# Patient Record
Sex: Female | Born: 1950
Health system: Southern US, Community
[De-identification: ages and names within clinical notes are randomized; demographics above are authoritative.]

## PROBLEM LIST (undated history)

## (undated) DIAGNOSIS — I10 Essential (primary) hypertension: Secondary | ICD-10-CM

## (undated) DIAGNOSIS — M109 Gout, unspecified: Secondary | ICD-10-CM

## (undated) DIAGNOSIS — Z8719 Personal history of other diseases of the digestive system: Secondary | ICD-10-CM

## (undated) DIAGNOSIS — R112 Nausea with vomiting, unspecified: Secondary | ICD-10-CM

## (undated) DIAGNOSIS — K5792 Diverticulitis of intestine, part unspecified, without perforation or abscess without bleeding: Secondary | ICD-10-CM

## (undated) DIAGNOSIS — K219 Gastro-esophageal reflux disease without esophagitis: Secondary | ICD-10-CM

## (undated) HISTORY — PX: TOTAL HIP ARTHROPLASTY: SHX124

## (undated) HISTORY — PX: HERNIA REPAIR: SHX51

---

## 1988-11-05 DIAGNOSIS — R87619 Unspecified abnormal cytological findings in specimens from cervix uteri: Secondary | ICD-10-CM | POA: Insufficient documentation

## 1998-01-18 ENCOUNTER — Ambulatory Visit (HOSPITAL_COMMUNITY): Admission: RE | Admit: 1998-01-18 | Discharge: 1998-01-18 | Payer: Self-pay | Admitting: *Deleted

## 1999-02-08 ENCOUNTER — Other Ambulatory Visit: Admission: RE | Admit: 1999-02-08 | Discharge: 1999-02-08 | Payer: Self-pay | Admitting: *Deleted

## 1999-02-21 ENCOUNTER — Ambulatory Visit (HOSPITAL_COMMUNITY): Admission: RE | Admit: 1999-02-21 | Discharge: 1999-02-21 | Payer: Self-pay | Admitting: *Deleted

## 2000-02-20 ENCOUNTER — Other Ambulatory Visit: Admission: RE | Admit: 2000-02-20 | Discharge: 2000-02-20 | Payer: Self-pay | Admitting: *Deleted

## 2000-02-23 ENCOUNTER — Ambulatory Visit (HOSPITAL_COMMUNITY): Admission: RE | Admit: 2000-02-23 | Discharge: 2000-02-23 | Payer: Self-pay | Admitting: *Deleted

## 2000-09-02 ENCOUNTER — Encounter: Payer: Self-pay | Admitting: Emergency Medicine

## 2000-09-02 ENCOUNTER — Emergency Department (HOSPITAL_COMMUNITY): Admission: EM | Admit: 2000-09-02 | Discharge: 2000-09-02 | Payer: Self-pay | Admitting: Emergency Medicine

## 2000-09-17 ENCOUNTER — Encounter: Admission: RE | Admit: 2000-09-17 | Discharge: 2000-09-17 | Payer: Self-pay | Admitting: Sports Medicine

## 2000-10-16 ENCOUNTER — Encounter: Admission: RE | Admit: 2000-10-16 | Discharge: 2000-10-16 | Payer: Self-pay | Admitting: Family Medicine

## 2000-11-08 ENCOUNTER — Encounter: Admission: RE | Admit: 2000-11-08 | Discharge: 2000-11-08 | Payer: Self-pay | Admitting: Family Medicine

## 2000-11-12 ENCOUNTER — Encounter: Admission: RE | Admit: 2000-11-12 | Discharge: 2000-11-12 | Payer: Self-pay | Admitting: Family Medicine

## 2000-12-17 ENCOUNTER — Encounter: Admission: RE | Admit: 2000-12-17 | Discharge: 2000-12-17 | Payer: Self-pay | Admitting: Family Medicine

## 2001-01-08 ENCOUNTER — Encounter: Admission: RE | Admit: 2001-01-08 | Discharge: 2001-01-08 | Payer: Self-pay | Admitting: Family Medicine

## 2001-02-17 ENCOUNTER — Encounter: Admission: RE | Admit: 2001-02-17 | Discharge: 2001-02-17 | Payer: Self-pay | Admitting: Family Medicine

## 2001-02-24 ENCOUNTER — Encounter: Admission: RE | Admit: 2001-02-24 | Discharge: 2001-02-24 | Payer: Self-pay | Admitting: Family Medicine

## 2001-04-18 ENCOUNTER — Encounter: Admission: RE | Admit: 2001-04-18 | Discharge: 2001-04-18 | Payer: Self-pay | Admitting: Family Medicine

## 2001-08-07 ENCOUNTER — Ambulatory Visit (HOSPITAL_COMMUNITY): Admission: RE | Admit: 2001-08-07 | Discharge: 2001-08-07 | Payer: Self-pay | Admitting: *Deleted

## 2001-08-07 ENCOUNTER — Encounter: Payer: Self-pay | Admitting: *Deleted

## 2001-12-11 ENCOUNTER — Encounter: Payer: Self-pay | Admitting: Orthopedic Surgery

## 2001-12-16 ENCOUNTER — Encounter: Payer: Self-pay | Admitting: Orthopedic Surgery

## 2001-12-16 ENCOUNTER — Inpatient Hospital Stay (HOSPITAL_COMMUNITY): Admission: RE | Admit: 2001-12-16 | Discharge: 2001-12-19 | Payer: Self-pay | Admitting: Orthopedic Surgery

## 2003-10-16 ENCOUNTER — Emergency Department (HOSPITAL_COMMUNITY): Admission: EM | Admit: 2003-10-16 | Discharge: 2003-10-16 | Payer: Self-pay | Admitting: Emergency Medicine

## 2003-10-28 ENCOUNTER — Ambulatory Visit (HOSPITAL_COMMUNITY): Admission: RE | Admit: 2003-10-28 | Discharge: 2003-10-28 | Payer: Self-pay | Admitting: Family Medicine

## 2004-06-07 ENCOUNTER — Ambulatory Visit (HOSPITAL_COMMUNITY): Admission: RE | Admit: 2004-06-07 | Discharge: 2004-06-07 | Payer: Self-pay | Admitting: Family Medicine

## 2004-07-21 ENCOUNTER — Ambulatory Visit: Payer: Self-pay | Admitting: Internal Medicine

## 2004-08-23 ENCOUNTER — Ambulatory Visit: Payer: Self-pay | Admitting: *Deleted

## 2004-09-12 ENCOUNTER — Ambulatory Visit: Payer: Self-pay | Admitting: Internal Medicine

## 2004-09-19 ENCOUNTER — Ambulatory Visit (HOSPITAL_COMMUNITY): Admission: RE | Admit: 2004-09-19 | Discharge: 2004-09-19 | Payer: Self-pay | Admitting: Internal Medicine

## 2004-09-20 ENCOUNTER — Ambulatory Visit: Payer: Self-pay | Admitting: Internal Medicine

## 2004-09-24 ENCOUNTER — Emergency Department (HOSPITAL_COMMUNITY): Admission: EM | Admit: 2004-09-24 | Discharge: 2004-09-24 | Payer: Self-pay | Admitting: Family Medicine

## 2004-09-27 ENCOUNTER — Ambulatory Visit: Payer: Self-pay | Admitting: Internal Medicine

## 2004-11-20 ENCOUNTER — Ambulatory Visit: Payer: Self-pay | Admitting: Internal Medicine

## 2004-12-11 ENCOUNTER — Ambulatory Visit: Payer: Self-pay | Admitting: Internal Medicine

## 2004-12-13 ENCOUNTER — Ambulatory Visit (HOSPITAL_COMMUNITY): Admission: RE | Admit: 2004-12-13 | Discharge: 2004-12-13 | Payer: Self-pay | Admitting: Internal Medicine

## 2004-12-25 ENCOUNTER — Ambulatory Visit: Payer: Self-pay | Admitting: Internal Medicine

## 2005-01-31 ENCOUNTER — Ambulatory Visit: Payer: Self-pay | Admitting: Internal Medicine

## 2005-02-06 ENCOUNTER — Inpatient Hospital Stay (HOSPITAL_COMMUNITY): Admission: RE | Admit: 2005-02-06 | Discharge: 2005-02-10 | Payer: Self-pay | Admitting: Orthopedic Surgery

## 2005-03-23 ENCOUNTER — Ambulatory Visit: Payer: Self-pay | Admitting: Internal Medicine

## 2005-06-12 ENCOUNTER — Ambulatory Visit: Payer: Self-pay | Admitting: Internal Medicine

## 2005-06-18 ENCOUNTER — Ambulatory Visit: Payer: Self-pay | Admitting: Internal Medicine

## 2005-07-14 ENCOUNTER — Emergency Department (HOSPITAL_COMMUNITY): Admission: EM | Admit: 2005-07-14 | Discharge: 2005-07-14 | Payer: Self-pay | Admitting: Family Medicine

## 2005-07-16 ENCOUNTER — Ambulatory Visit: Payer: Self-pay | Admitting: Internal Medicine

## 2005-08-31 ENCOUNTER — Ambulatory Visit: Payer: Self-pay | Admitting: Internal Medicine

## 2005-10-22 ENCOUNTER — Ambulatory Visit: Payer: Self-pay | Admitting: Family Medicine

## 2005-11-09 ENCOUNTER — Ambulatory Visit: Payer: Self-pay | Admitting: Internal Medicine

## 2005-11-20 ENCOUNTER — Ambulatory Visit: Payer: Self-pay | Admitting: Internal Medicine

## 2005-12-05 ENCOUNTER — Ambulatory Visit (HOSPITAL_COMMUNITY): Admission: RE | Admit: 2005-12-05 | Discharge: 2005-12-05 | Payer: Self-pay | Admitting: Internal Medicine

## 2005-12-19 ENCOUNTER — Ambulatory Visit: Payer: Self-pay | Admitting: Internal Medicine

## 2005-12-19 ENCOUNTER — Encounter: Payer: Self-pay | Admitting: Internal Medicine

## 2005-12-26 ENCOUNTER — Ambulatory Visit (HOSPITAL_COMMUNITY): Admission: RE | Admit: 2005-12-26 | Discharge: 2005-12-26 | Payer: Self-pay | Admitting: Internal Medicine

## 2006-01-07 ENCOUNTER — Ambulatory Visit (HOSPITAL_COMMUNITY): Admission: RE | Admit: 2006-01-07 | Discharge: 2006-01-07 | Payer: Self-pay | Admitting: Surgery

## 2006-02-15 ENCOUNTER — Ambulatory Visit: Payer: Self-pay | Admitting: Internal Medicine

## 2006-07-09 ENCOUNTER — Ambulatory Visit: Payer: Self-pay | Admitting: Family Medicine

## 2006-10-08 ENCOUNTER — Ambulatory Visit: Payer: Self-pay | Admitting: Family Medicine

## 2006-12-09 ENCOUNTER — Ambulatory Visit (HOSPITAL_COMMUNITY): Admission: RE | Admit: 2006-12-09 | Discharge: 2006-12-09 | Payer: Self-pay | Admitting: Internal Medicine

## 2006-12-10 ENCOUNTER — Ambulatory Visit: Payer: Self-pay | Admitting: Internal Medicine

## 2006-12-31 ENCOUNTER — Ambulatory Visit: Payer: Self-pay | Admitting: Internal Medicine

## 2007-02-06 ENCOUNTER — Ambulatory Visit: Payer: Self-pay | Admitting: Internal Medicine

## 2007-02-06 LAB — CONVERTED CEMR LAB
Cholesterol: 315 mg/dL
LDL Cholesterol: 218 mg/dL
Triglycerides: 117 mg/dL

## 2007-02-19 ENCOUNTER — Ambulatory Visit: Payer: Self-pay | Admitting: Family Medicine

## 2007-03-18 ENCOUNTER — Ambulatory Visit: Payer: Self-pay | Admitting: Internal Medicine

## 2007-03-18 ENCOUNTER — Encounter (INDEPENDENT_AMBULATORY_CARE_PROVIDER_SITE_OTHER): Payer: Self-pay | Admitting: *Deleted

## 2007-03-18 LAB — CONVERTED CEMR LAB: Creatinine, Ser: 0.8 mg/dL

## 2007-03-25 ENCOUNTER — Ambulatory Visit (HOSPITAL_COMMUNITY): Admission: RE | Admit: 2007-03-25 | Discharge: 2007-03-25 | Payer: Self-pay | Admitting: Internal Medicine

## 2007-04-21 ENCOUNTER — Ambulatory Visit: Payer: Self-pay | Admitting: Internal Medicine

## 2007-05-17 ENCOUNTER — Encounter (INDEPENDENT_AMBULATORY_CARE_PROVIDER_SITE_OTHER): Payer: Self-pay | Admitting: Internal Medicine

## 2007-05-17 DIAGNOSIS — K449 Diaphragmatic hernia without obstruction or gangrene: Secondary | ICD-10-CM | POA: Insufficient documentation

## 2007-05-17 DIAGNOSIS — G5620 Lesion of ulnar nerve, unspecified upper limb: Secondary | ICD-10-CM | POA: Insufficient documentation

## 2007-05-17 DIAGNOSIS — E782 Mixed hyperlipidemia: Secondary | ICD-10-CM | POA: Insufficient documentation

## 2007-05-17 DIAGNOSIS — M720 Palmar fascial fibromatosis [Dupuytren]: Secondary | ICD-10-CM

## 2007-05-17 DIAGNOSIS — N3090 Cystitis, unspecified without hematuria: Secondary | ICD-10-CM | POA: Insufficient documentation

## 2007-05-17 DIAGNOSIS — E669 Obesity, unspecified: Secondary | ICD-10-CM | POA: Insufficient documentation

## 2007-05-17 DIAGNOSIS — M1610 Unilateral primary osteoarthritis, unspecified hip: Secondary | ICD-10-CM | POA: Insufficient documentation

## 2007-05-17 DIAGNOSIS — K219 Gastro-esophageal reflux disease without esophagitis: Secondary | ICD-10-CM | POA: Insufficient documentation

## 2007-05-17 DIAGNOSIS — M87059 Idiopathic aseptic necrosis of unspecified femur: Secondary | ICD-10-CM | POA: Insufficient documentation

## 2007-05-17 DIAGNOSIS — J309 Allergic rhinitis, unspecified: Secondary | ICD-10-CM | POA: Insufficient documentation

## 2007-05-17 DIAGNOSIS — M169 Osteoarthritis of hip, unspecified: Secondary | ICD-10-CM | POA: Insufficient documentation

## 2007-05-17 HISTORY — DX: Palmar fascial fibromatosis (dupuytren): M72.0

## 2007-05-17 HISTORY — DX: Idiopathic aseptic necrosis of unspecified femur: M87.059

## 2007-05-17 HISTORY — DX: Mixed hyperlipidemia: E78.2

## 2007-05-21 ENCOUNTER — Ambulatory Visit: Payer: Self-pay | Admitting: Internal Medicine

## 2007-05-21 LAB — CONVERTED CEMR LAB: Cholesterol: 306 mg/dL

## 2007-05-29 ENCOUNTER — Ambulatory Visit: Payer: Self-pay | Admitting: Internal Medicine

## 2007-07-01 ENCOUNTER — Ambulatory Visit: Payer: Self-pay | Admitting: Internal Medicine

## 2007-07-23 ENCOUNTER — Encounter (INDEPENDENT_AMBULATORY_CARE_PROVIDER_SITE_OTHER): Payer: Self-pay | Admitting: *Deleted

## 2007-08-25 ENCOUNTER — Encounter (INDEPENDENT_AMBULATORY_CARE_PROVIDER_SITE_OTHER): Payer: Self-pay | Admitting: *Deleted

## 2007-08-25 ENCOUNTER — Ambulatory Visit (HOSPITAL_COMMUNITY): Admission: RE | Admit: 2007-08-25 | Discharge: 2007-08-25 | Payer: Self-pay | Admitting: *Deleted

## 2007-10-11 ENCOUNTER — Emergency Department (HOSPITAL_COMMUNITY): Admission: EM | Admit: 2007-10-11 | Discharge: 2007-10-11 | Payer: Self-pay | Admitting: Emergency Medicine

## 2008-03-25 ENCOUNTER — Ambulatory Visit (HOSPITAL_COMMUNITY): Admission: RE | Admit: 2008-03-25 | Discharge: 2008-03-25 | Payer: Self-pay | Admitting: Internal Medicine

## 2008-09-14 ENCOUNTER — Other Ambulatory Visit: Admission: RE | Admit: 2008-09-14 | Discharge: 2008-09-14 | Payer: Self-pay | Admitting: Internal Medicine

## 2009-03-28 ENCOUNTER — Ambulatory Visit (HOSPITAL_COMMUNITY): Admission: RE | Admit: 2009-03-28 | Discharge: 2009-03-28 | Payer: Self-pay | Admitting: Internal Medicine

## 2009-05-08 ENCOUNTER — Emergency Department (HOSPITAL_COMMUNITY): Admission: EM | Admit: 2009-05-08 | Discharge: 2009-05-08 | Payer: Self-pay | Admitting: Family Medicine

## 2009-09-22 ENCOUNTER — Other Ambulatory Visit: Admission: RE | Admit: 2009-09-22 | Discharge: 2009-09-22 | Payer: Self-pay | Admitting: Internal Medicine

## 2010-05-05 ENCOUNTER — Ambulatory Visit (HOSPITAL_COMMUNITY): Admission: RE | Admit: 2010-05-05 | Discharge: 2010-05-05 | Payer: Self-pay | Admitting: Internal Medicine

## 2010-11-26 ENCOUNTER — Encounter: Payer: Self-pay | Admitting: Internal Medicine

## 2011-03-20 NOTE — Op Note (Signed)
[name], [name]:  [id number]   MEDICAL RECORD NO.:  [id number]          PATIENT TYPE:  AMB   LOCATION:  ENDO                         FACILITY:  [facility]   PHYSICIAN:  Georgiana Spinner, M.D.    DATE OF BIRTH:  [birth year]   DATE OF PROCEDURE:  08/25/2007  DATE OF DISCHARGE:                               OPERATIVE REPORT   PROCEDURE:  Upper endoscopy with biopsy.   INDICATIONS:  GERD.   ANESTHESIA:  Phenergan 12.5 mg, fentanyl 75 mcg, Versed 7 mg.   PROCEDURE:  With the patient mildly sedated in the left lateral  decubitus position, the Pentax videoscopic endoscope was inserted in the  mouth, passed under direct vision through the esophagus, which appeared  normal until we reached distal esophagus, and there was a question of an  area of Barrett's, photographed and biopsied.  We entered into the  stomach through a hiatal hernia.  Fundus, body, antrum, duodenal bulb,  second portion of the duodenum appeared normal.  From this point the  endoscope was slowly withdrawn taking circumferential views of the  duodenal mucosa until the endoscope had been pulled back in the stomach,  placed in retroflexion to view the stomach from below.  The endoscope  was straightened and withdrawn taking circumferential views of the  remaining gastric and esophageal mucosa.  The patient's vital signs and  pulse oximetry remained stable.  The patient tolerated the procedure  well and without apparent complications.   FINDINGS:  Question of Barrett's esophagus above a hiatal hernia.   Await biopsy report.  The patient will call me for results and follow up  with me as an outpatient.  Proceed to colonoscopy as planned.           ______________________________  Georgiana Spinner, M.D.     GMO/MEDQ  D:  08/25/2007  T:  08/26/2007  Job:  [id number]

## 2011-03-20 NOTE — Op Note (Signed)
Angela Greer, Angela Greer NO.:  [id number]   MEDICAL RECORD NO.:  [id number]          PATIENT TYPE:  AMB   LOCATION:  ENDO                         FACILITY:  [facility]   PHYSICIAN:  Georgiana Spinner, M.D.    DATE OF BIRTH:  1951/06/01   DATE OF PROCEDURE:  08/25/2007  DATE OF DISCHARGE:                               OPERATIVE REPORT   PROCEDURE:  Colonoscopy.   INDICATIONS:  Colon polyps.   ANESTHESIA:  1. Fentanyl 25 mcg.  2. Versed 3 mg.   PROCEDURE:  With the patient mildly sedated in the left lateral  decubitus position, the Pentax videoscopic colonoscope was inserted into  the rectum and passed under direct vision to the cecum, identified by  the ileocecal valve and appendiceal orifice, both of which were  photographed.  From this point, the colonoscope was slowly withdrawn,  taking circumferential views of the colonic mucosa, stopping in the  descending colon, were a polyp was seen, photographed, and removed.  Unfortunately, the nurse inadvertently sliced through the polyp without  cautery.  Bleeding ensued.  I elected used hot biopsy to touch the  bleeding base, but oozing continued, so I injected 1 mL of epinephrine  into the area, and bleeding seemed to stop at that point.  The polyp,  however, was lost despite trying to suction it through the endoscope  into a tissue trap, but it appeared benign on photograph.  From this  point, the colonoscope was slowly withdrawn, taking circumferential  views of the remaining colonic mucosa, stopping just above the rectum,  where a second polyp was seen.  It too was photographed, and it was  removed using hot biopsy forceps technique, with the setting of 20/150  blended current.  Tissue was retrieved for pathology.  The endoscope was  withdrawn to the rectum, which appeared normal on direct and showed  hemorrhoids on retroflexed view.  The endoscope was straightened and  withdrawn.  The patient's vital signs and pulse  oximeter remained  stable.  The patient tolerated the procedure well without apparent  complications.   FINDINGS:  Internal hemorrhoids.  Polyps in descending colon and  rectosigmoid area.  Await biopsy report.  The patient will call me for  results and follow up with me as an outpatient.           ______________________________  Georgiana Spinner, M.D.     GMO/MEDQ  D:  08/25/2007  T:  08/26/2007  Job:  [id number]

## 2011-03-23 NOTE — Op Note (Signed)
NAMEDAJANAY, NORTHRUP NO.:  [id number]   MEDICAL RECORD NO.:  [id number]          PATIENT TYPE:  INP   LOCATION:  2550                         FACILITY:  [facility]   PHYSICIAN:  Deidre Ala, M.D.    DATE OF BIRTH:  August 25, 1951   DATE OF PROCEDURE:  02/06/2005  DATE OF DISCHARGE:                                 OPERATIVE REPORT   PREOPERATIVE DIAGNOSIS:  Whole head involvement arteriovenous nicking right  hip with pain.   POSTOPERATIVE DIAGNOSIS:  Whole head involvement arteriovenous nicking right  hip with pain.   PROCEDURE:  Right total hip arthroplasty using uncemented DePuy components,  pinnacle cup and SUMMIT tapered stem with HA coating.   SURGEON:  1.  Charlesetta Shanks, M.D.   ASSISTANT:  Clarene Reamer, P.A.-C.   ANESTHESIA:  General endotracheal anesthesia.   CULTURES:  None.   DRAINS:  Two medium Hemovacs to Autovac.   ESTIMATED BLOOD LOSS:  300 mL.   REPLACEMENT:  Without.   PATHOLOGIC FINDINGS AND HISTORY:  Shakila underwent successful left total hip  arthroplasty in 2003 with good result with a ceramic head and polyethylene  liner, Osteonics type.  She began to have right hip pain, saw [facility], saw Dineen Kid. Reche Dixon, M.D., was sponsored under  vocational rehab and came in with her MRI scan.  She was having  Trendelenburg gait with marked pain and limited internal and external  rotation arch.  X-rays did not reveal overt collapse but she had whole head  involvement.  At surgery, no other untoward features were noted.  The  acetabulum was somewhat oblong as was the head.  We ended up using a size 54  pinnacle sector cup.  We used three cancellous screws to stabilize it  because it was not fully stable and this did stabilize it in the appropriate  anteversion and flexion.  We used a hole eliminator.  We used a 32 mm inner  liner with a 20 mm posterior lip of poly to capture the femoral head for  stability and we used a #5  SUMMIT tapered stem, a 12-14 taper with a +5  femoral neck and the femoral head was a +5 32 mm ceramic femoral head.  We  had stability with flexion to 90 degrees internal rotation to 75 to 80 and  no push/pull instability to external rotation and extension.  The center of  the head matched the height of the trochanter.  We did take a postoperative  intraoperative x-ray which showed the three screws in satisfactory position,  the cup in excellent position with anteversion and abduction appropriate  with a well centered femoral head and stem.   DESCRIPTION OF PROCEDURE:  With adequate anesthesia obtained using  endotracheal technique, 1 g Ancef was given IV prophylaxis and another one  at prosthetic implantation.  The patient was placed in the left lateral  decubitus position with the right side up with the hip positioner.  After  standard prepping and draping, an Austin-Moore type approach was made.  Incision was deepened sharply with the knife  and hemostasis obtained using  the Bovie electrocoagulator.  Retractors were placed and the incision was  carried out on the gluteus fascia and the iliotibial band.  Retractors were  again further placed and the short external rotators were removed.  The  quadratus femoris removed and tagged.  The pyriformis removed and tagged.  The capsule removed and tagged and brought back with a flap over the back  posteriorly.  This then exposed the femoral neck.  The head was dislocated  and using the broach as a template, we made the initial femoral neck cut.  We then exposed the acetabulum and did a labrectomy.  We then successfully  reamed up to a 53.  We placed the trials and then we placed the 53 mm cup in  the appropriate anteversion with the trial liner for a metal on metal  system.  We then exposed the proximal femur, made an additional calcar cut  to get it exactly accurate about 10 mm above the lesser, 10 to 15.  We made  that cut and then we started  successive reamers up to a 5.  We broached to a  5 and then used the calcar reamer.  We then left the broach in and trialed a  +5 and  +10, noticed we did not have as much stability in that the  acetabulum had extended itself so we went back to the acetabulum, reexposed  it, re-reamed, packs and bone graft and then placed the 53 cup back in the  appropriate anteversion and abduction.  We then placed three screws superior  cancellous type to stabilize it which it did.  We then trialed again, felt  that the internal rotation and flexion was still a little bit unstable,  coming out about 50 degrees so I decided to abandon metal on metal and put a  poly liner in, a 20 degree lip, placed posterior at about 9 o'clock and  trialed with that and we had excellent stability.  This was with a +5  femoral head.  I then removed the broach and head and neck.  I then exposed  the acetabulum.  I moved the poly trial.  I then placed the real poly liner  with the 20 degree lip at 9 o'clock with the lip and impacted it.  The  acetabulum was stable.  I then exposed the proximal femur and placed the  SUMMIT #5 tapered stem in appropriate anteversion.  I then placed on the +5  mm head and packed it, articulated the hip and put it through a range of  motion.  No push/pull instability, no instability on external rotation or  extension, full hip extension with knee flexion to 90 and internal rotation  in 90 degrees of flexion got up to 75 to 80 degrees.  We did use a ceramic  +5 femoral head that we placed on, so it is ceramic on poly.  I then  thoroughly irrigated the wound with antibiotic solution, another gram of  Ancef was given as listed and meticulous capsular closure was carried out  with the tagging sutures being brought back to drill holes on the posterior  capsule as well as the pyriformis and the quadratus.  I then placed Hemovac  deep to the fascia and out distally to be hooked to Autovac.  The wound  was then closed in layers with #1 Vicryl and #1 PDS running locking, 0 and 2-0  Vicryl subcu and a 3-0 Monocryl with Steri-Strips.  Care was made sure that  the drains continually were able to be moved back and forth slightly so as  to not be sewn in.  Bulky sterile compressive dressing was applied with knee  immobilizer.  Prior to this, an x-ray had been taken of the hip.  It had  excellent position of the femoral and acetabular prosthesis and the three  screws.  There may be a central essentially minimally displaced acetabular  fracture, one fracture line that we saw.  This may have been the reason that  the cup spun, that it did crack on impaction but I think this will stimulate  healing, will heal very well, is certainly stable now.  We will keep her  touchdown weightbearing and watch it closely.  She was admitted to the floor  for routine postoperative care.      VEP/MEDQ  D:  02/06/2005  T:  02/06/2005  Job:  [id number]   cc:   Dineen Kid. Reche Dixon, M.D.  [address] [place]  [place] [address]  Fax: [phone]

## 2011-03-23 NOTE — Discharge Summary (Signed)
[facility]  Patient:    Angela Greer, Angela Greer Visit Number: [id number] MRN: [id number]          Service Type: SUR Location: [id number] Attending Physician:  Drema Pry Dictated by:   Anise Salvo. Chestine Spore, P.A.-C Admit Date:  12/16/2001 Discharge Date: 12/19/2001                             Discharge Summary  ADMISSION DIAGNOSES: Degenerative joint disease left hip with aseptic necrosis and collapse.  DISCHARGE DIAGNOSES:  Degenerative joint disease left hip with aseptic necrosis and collapse, status post left total hip replacement.  HOSPITAL COURSE:  Patient was brought to the operating room at [facility] with a history of chronic left hip pain.  She had been experiencing pain on weight-bearing, pain with every step and pain with sitting for several years though it had been worsening of late and she had evidence of avascular necrosis of the left hip with collapse and degenerative joint disease radiographically.  The patient was brought to the operating room, placed on the operating room table and was prepped out in the usual fashion, right side down and her left hip was replaced using uncemented HA-coated Osteonics components, Trident acetabulum with ceramic head and secure fit Plus stem. Procedure was carried out under general endotracheal anesthesia and wound was closed over two medium Hemovac drains and autovac.  There was an estimated blood loss of 900 cc.  She was taken to PACU in stable condition and admitted to the floor with PCA morphine for pain control.  On postoperative day #1 12/17/01 the patient was doing well.  Her only complaint was some heartburn and reflux.  The patient had been stable through the night, afebrile.  She was encouraged to use her incentive device and was given Protonix and her Zantac was changed to b.i.d. Her protime was 14.2 with INR of 1.1, hemoglobin and hematocrit were 9.5 and 27.5.  Basic metabolic  panel was normal except for glucose elevated at 143 and calcium decreased at 8.2.  Physical therapy, occupational therapy and rehabilitation consultations were obtained recommending that in spite of her living alone, because she had family planning to be there setting up shifts to provide coverage for her for the first week they were recommending her discharge home.  On postoperative day #2 she was doing fine, had been ambulating out of bed to the chair and doing some walking in the hall the day before, was otherwise doing fine and had no complaints at that time.  The wound was inspected.  The dressings were changed.  The wound looked good.  There was no evidence of infection or hematoma and her drains were removed.  There had been no active drainage for 24 hours.  Postoperative x-rays were reviewed, two views of the left hip with slight anteversion of the prosthesis but good position of the components and her laboratory studies on postoperative day #2 showed protime 20.4, INR of 2.1, hemoglobin and hematocrit 8.8 and 25.3.  Metabolic panel with everything within normal limits except for a  potassium low at 3.4 and calcium low at 8.0.  She was prescribed calcium 600 mg and vitamin D three p.o. q.d. She was encouraged to ambulate and work with physical therapy assisting her toward mod-1.  She stated a strong desire to go home the next day. On 12/19/01 postoperative day #3 she was doing well, she said  her pain was well controlled.  Her intravenous had been discontinued.  She was walking well with walker without difficulty and reiterated her strong desire to go home and she was discharged at this time.  CONDITION ON DISCHARGE:  Stable.  MEDICATIONS AT DISCHARGE: 1. Protonix 40 mg one p.o. q.d., b.i.d. #60 with one refill prescription    given. 2. Percocet 325/5 mg, one to two p.o. q.6h p.r.n. #40 with one refill    prescription given. 3. Ibuprofen 200 mg, two to there p.o. q.6h p.r.n. that  she can alternate    by three hours with the Percocet if needed for pain. 4. Zocor 40 mg one p.o. q.d. 5. Coumadin 2 mg one p.o. q.d. until told otherwise by home health nurse    per Genevieve Norlander as instructed by label.  ACTIVITY:  Weight-bearing as tolerated with walker until cleared by physical therapy for cane use.  She is advised to keep the immobilizer on her left knee.  DIET:  Patient was sent home on a diet with no restrictions.  WOUND CARE: Keep the wound clean and dry.  She may remove the dressing and shower on Sunday 12/21/2001, postoperative day #5, call Dr. Renae Fickle in the office and discontinue the dressing at that time.  SPECIAL INSTRUCTIONS: Patient to follow with [facility] for blood draws for protime and INR and management of her Coumadin therapy. She was given a follow up appointment with Dr. Renae Fickle Monday 12/22/01 in his office.  She is advised to call for an appointment and any questions or concerns for fever greater than 101 degrees, pain unrelieved with pain medications or increasing drainage from her wound.  She was discharged in stable and improved condition. Dictated by:   Anise Salvo. Chestine Spore, P.A.-C Attending Physician:  Drema Pry DD:  01/08/02 TD:  01/11/02 Job: [id number]

## 2011-03-23 NOTE — Discharge Summary (Signed)
NAMEALONI, Angela Greer NO.:  [id number]   MEDICAL RECORD NO.:  [id number]          PATIENT TYPE:  INP   LOCATION:  5006                         FACILITY:  [facility]   PHYSICIAN:  Deidre Ala, M.D.    DATE OF BIRTH:  01-16-1951   DATE OF ADMISSION:  02/06/2005  DATE OF DISCHARGE:  02/10/2005                                 DISCHARGE SUMMARY   ADMISSION DIAGNOSES:  1. Avascular necrosis and end-stage osteoarthritis of the right hip.  2. Gastroesophageal reflux disease.  3. History of hiatal hernia.  4. Hypercholesterolemia.   DISCHARGE DIAGNOSES:  1. Avascular necrosis and end-stage osteoarthritis of the right hip status      post right total hip arthroplasty.  2. Gastroesophageal reflux disease.  3. Hiatal hernia.  4. Hypercholesterolemia.  5. Postoperative hemorrhagic anemia, stable on discharge.   PROCEDURE:  The patient was taken to the operating room on February 06, 2005 and  underwent a right total hip arthroplasty.  Surgeon was Dr. Alvester Morin,  assistant is Clarene Reamer, P.A.-C.  The surgery was done under general  anesthesia and a Hemovac drain x1 was placed at the time of surgery.   CONSULTATIONS:  Physical therapy, occupational therapy, social work, case  management.   BRIEF HISTORY:  The patient is a 60 year old female with a history of a left  total hip replacement in the past due to avascular necrosis.  She began to  have right hip pain which was evaluated in the office which revealed cystic  changes in both the femur and the acetabulum with collapse of the femoral  head.  Upon reviewing these x-rays, Dr. Renae Fickle felt it was best to proceed  with a right total hip arthroplasty.  The patient agreed.  The risks and  benefits of the surgery were discussed with the patient and the patient  wished to proceed.   LABORATORY DATA:  CBC on admission showed a hemoglobin 14.8, hematocrit  43.0, white blood cell count 8.1, red blood cell count 4.96, serial  H&H's  were followed throughout the hospital stay, hemoglobin and hematocrit did  decline to 9.1 and 25.8 on February 10, 2005, but was stable at the time of  discharge.  Differential on admission showed lymphocytes high at 47,  coagulation studies on admission showed PT slightly low at 11.6, otherwise  normal.  PT/INR at the time of discharge were 17.7 and 1.7 respectively on  Coumadin therapy.  Routine chemistries on admission was all within normal  limits.  Serial chemistries were followed throughout hospital stay, sodium  did decline to 134 on February 08, 2005, but was back in a normal range at 137  on February 09, 2005.  Glucose ranged from a low of 85 at the time of admission  to a high of 142 on February 07, 2005.  BUN did fall to 4 on February 08, 2005, but  was back in a normal range at 7 the following day.  Urinalysis on admission  was also within normal limits.  The patients blood type is A positive with  antibody screen  negative.  Preop EKG shows probable sinus bradycardia,  otherwise normal EKG.  Radiographs of the right hip on February 06, 2005 showed  satisfactory postoperative appearance of the right hip, follow-up x-rays of  the right hip on February 07, 2005 showed right total hip arthroplasty with  anatomic alignment with drains slightly retracted.   HOSPITAL COURSE:  The patient was admitted to [facility] and taken  to the operating room.  She underwent the above stated procedure without  complications, and the patient tolerated the procedure well and allowed to  return to the recovery room and orthopedic floor to continue postoperative  care.  On postop day #1 the patient was complaining of some pain as  expected, hemoglobin and hematocrit were 11.2 and 32.4, heart rate was 102,  she was afebrile, she was neurovascularly intact to the right lower  extremity.  The patient was to work with physical therapy touchdown weight-  bearing with total hip precautions.  Postop day #2 the patient  continued to  rest comfortably, T-max was 100.9,  hemoglobin and hematocrit 10.6 and 30.2,  she was neurovascularly intact in her right lower extremity, incision was  clean, dry and intact, dressing was changed, Hemovac was discontinued as  well as the Foley and PCA on this day.  X-rays were reviewed and were in  excellent position.  February 09, 2005 postop day #3 the patient a little slow  with physical therapy, T-max was 100, vital signs were stable, hemoglobin  and hematocrit 9.4 and 26.8, respectively, incision was clean, dry and  intact, she was neurovascularly intact in her right lower extremity.  The  patient was to continue working with physical therapy, occupational therapy  with probable discharge home on the following day.  On February 10, 2005, postop  day #4 the patient was afebrile, was ready to go home, had progressed well  with physical therapy, occupational therapy and was discharged home on February 10, 2005.   DISPOSITION:  The patient was discharged home on February 10, 2005.   DISCHARGE MEDICATIONS:  1. Percocet 5/325 1-2 p.o. q.4-6h. p.r.n.  2. Robaxin 500 mg 1 q.6-8h. p.r.n.  3. Coumadin per pharmacy protocol.   DIET:  As tolerated.   ACTIVITY:  The patient is touchdown weight-bearing to the right lower  extremity.   WOUND CARE:  The patient is to have daily dressing changes performed until  no drainage.  She may shower when no drainage.   FOLLOWUP:  The patient is to follow-up with Dr. Renae Fickle two weeks from the date  of surgery, she is to call our office for an appointment at [phone].   CONDITION ON DISCHARGE:  Stable and improved.      Pearline Cables   SW/MEDQ  D:  05/03/2005  T:  05/03/2005  Job:  [id number]

## 2011-03-23 NOTE — Op Note (Signed)
[place]  Patient:    Angela Greer, Angela Greer Visit Number: [id number] MRN: [id number]          Service Type: SUR Location: R[facility]M 2550 05 Attending Physician:  Drema Pry Dictated by:   Jearld Adjutant, M.D. Proc. Date: 12/16/01 Admit Date:  12/16/2001   CC:         Marinus Maw, M.D.   Operative Report  PREOPERATIVE DIAGNOSIS: Degenerative joint disease, left hip, with aseptic necrosis with collapse.  POSTOPERATIVE DIAGNOSIS: Degenerative joint disease, left hip, with aseptic necrosis with collapse.  OPERATION/PROCEDURE: Left total hip arthroplasty using uncemented HA-coated Osteonics components, Trident acetabulum, ceramic head, and a Secur-Fit Plus stem.  SURGEON: Jearld Adjutant, M.D.  ASSISTANTS:  1. Lubertha Basque Jerl Santos, M.D.  2. Anise Salvo. Clark, P.A.-C.  ANESTHESIA: General endotracheal.  CULTURES: None.  DRAINS: Two medium Hemovacs to Autovac.  ESTIMATED BLOOD LOSS: Nine hundred cc.  REPLACEMENT: Without.  PATHOLOGIC FINDINGS/HISTORY: Angela Greer came to our practice on November 19, 2001 with complaint of pain and stiffness in the left hip.  We got x-rays and it showed AVN, aseptic necrosis on the left, with head collapse.  There was osteoarthritis of the opposite hip, but it did not show AVN on the right.  She is having miserable pain, so we decided to proceed with surgery as above.  She had the classic ironic hypervascularity of the capsule, and had more bleeding than usual with venous bleeders within the capsule, but it was ultimately contained.  We ended up fitting her a with #7 11 distal diameter Secur-Fit Plus HA stem, a 36 mm C-taper Alumina ceramic head in this 60 year old, and a 36 mm 10 degree Trident insert, size F, and a 54 mm Trident HA PSL cup with no holes.  Increased anteversion and coverage was obtained over the original acetabular position and we reamed, reaming most of the posterior wall down because it  was primarily osteophyte, and was conical relative to the new acetabulums more spherical shape.  We did get excellent tightness of the impacted cup and anteversion flexion.  She was stable with push-pull. External rotation and extension, can fully extend with the knee flexed 90 degrees.  In addition, on flexion with internal rotation before dislocation we got her to essentially 80 degrees with internal rotation.  We tried a +5 head but we could not really get that in.  We felt the leg lengths to be slightly increased on the left versus the right after surgery.  She was a little short on that side preoperatively.  DESCRIPTION OF PROCEDURE: With adequate anesthesia obtained using endotracheal technique, 1 g Ancef was given prophylaxis and another one after component placement.  The patient was placed in the right lateral decubitus position with the left side up with a hip positioner.  After standard prepping and draping we made an incision over the hip, slightly curvilinear for a posterior approach.  The incision was deepened sharply and operative hemostasis obtained using the Bovie electrocoagulator.  Dissection was carried down to the fascia, which was retracted with a Charnley retractor.  We then placed retractors around the hip capsule and tagged and cut the piriformis, and the capsule, cauterizing bleeding points.  The capsular flap was taken back medially, exposing the femoral neck and head.  I then made a femoral neck cut about a fingerbreadth above the lesser in the appropriate orientation, and removed the head.  We then exposed the acetabulum, excised osteophyte and labrum. Successive  reaming was carried out up to a 54 ream in the appropriate anteversion, flexion, abduction.  We then trialed a 54, trialed a 56.  We then dusted the rim with a 55 reamer, the actual implant being a 55-8 for the 54. We then impacted the 54 Trident cup with excellent position.  Some of the anterior  osteophyte was then removed so we would not impinge.  We then placed a trial liner.  We then exposed the proximal femur and did successive canal and trochanteric reaming, and then successively reamed up to a #7, and then broached to a 7, slightly effect the broach.  I then used the calcar reamer to flatten the calcar.  We then placed on the tower +0 with the above findings and stability.  We then broached with the gold-tipped broach after reaming distally for the distal diameter until an 11.5 with endosteal bone in the teeth of the reamers.  At this point we then removed the broach from the canal after getting in the gold-tipped broach, in appropriate anteversion, and went back to the acetabulum, where we placed the 10 degree Trident insert, dialed at about three oclock posterior, and locked it in.  We then came back in and implanted the #7 11 distal diameter Secur-Fit HA-Plus stem with slight anteversion to its appropriate level.  Irrigation was carried out.  We trialed a +0 and a +5, but went back to the +0 head, 36 mm.  Thorough irrigation was then carried out.  We then closed the capsule back to the posterior trochanter with drill holes in a horizontal mattress suture of #1 Ethibond tags.  We then closed back the piriformis with #1 Ethibond.  We then placed Hemovac drains deep in the wound, closed the fascia with interrupted #1 Vicryl, #1-0 and 2-0 Vicryl on the subcu running, and skin staples.  Hemovac drains were brought out through the distal portal and hooked up to an Autovac.  A compressive dressing was applied.  The patient then, having tolerated the procedure well, was awakened and taken to the recovery room in satisfactory condition in a knee immobilizer for routine postoperative care.  n Dictated by:   Jearld Adjutant, M.D. Attending Physician:  Drema Pry DD:  12/16/01 TD:  12/16/01 Job: [id number]

## 2011-04-23 ENCOUNTER — Other Ambulatory Visit (HOSPITAL_COMMUNITY): Payer: Self-pay | Admitting: Internal Medicine

## 2011-04-23 DIAGNOSIS — Z1231 Encounter for screening mammogram for malignant neoplasm of breast: Secondary | ICD-10-CM

## 2011-05-07 ENCOUNTER — Ambulatory Visit (HOSPITAL_COMMUNITY)
Admission: RE | Admit: 2011-05-07 | Discharge: 2011-05-07 | Disposition: A | Discharge status: Home / Self Care | Payer: Medicare Other | Source: Ambulatory Visit | Attending: Internal Medicine | Admitting: Internal Medicine

## 2011-05-07 DIAGNOSIS — Z1231 Encounter for screening mammogram for malignant neoplasm of breast: Secondary | ICD-10-CM | POA: Insufficient documentation

## 2011-06-27 ENCOUNTER — Other Ambulatory Visit (HOSPITAL_COMMUNITY)
Admission: RE | Admit: 2011-06-27 | Discharge: 2011-06-27 | Disposition: A | Discharge status: Home / Self Care | Payer: Medicare Other | Source: Ambulatory Visit | Attending: Internal Medicine | Admitting: Internal Medicine

## 2011-06-27 ENCOUNTER — Other Ambulatory Visit: Payer: Self-pay | Admitting: Internal Medicine

## 2011-06-27 DIAGNOSIS — Z01419 Encounter for gynecological examination (general) (routine) without abnormal findings: Secondary | ICD-10-CM | POA: Insufficient documentation

## 2011-10-26 ENCOUNTER — Other Ambulatory Visit: Payer: Self-pay | Admitting: Internal Medicine

## 2011-10-26 DIAGNOSIS — R911 Solitary pulmonary nodule: Secondary | ICD-10-CM

## 2011-10-31 ENCOUNTER — Ambulatory Visit
Admission: RE | Admit: 2011-10-31 | Discharge: 2011-10-31 | Disposition: A | Discharge status: Home / Self Care | Payer: Medicare Other | Source: Ambulatory Visit | Attending: Internal Medicine | Admitting: Internal Medicine

## 2011-10-31 DIAGNOSIS — R911 Solitary pulmonary nodule: Secondary | ICD-10-CM

## 2011-10-31 MED ORDER — IOHEXOL 300 MG/ML  SOLN
75.0000 mL | Freq: Once | INTRAMUSCULAR | Status: AC | PRN
  Administered 2011-10-31: 75 mL via INTRAVENOUS

## 2012-04-18 ENCOUNTER — Other Ambulatory Visit (HOSPITAL_COMMUNITY): Payer: Self-pay | Admitting: Internal Medicine

## 2012-04-18 DIAGNOSIS — Z1231 Encounter for screening mammogram for malignant neoplasm of breast: Secondary | ICD-10-CM

## 2012-05-13 ENCOUNTER — Ambulatory Visit (HOSPITAL_COMMUNITY)
Admission: RE | Admit: 2012-05-13 | Discharge: 2012-05-13 | Disposition: A | Discharge status: Home / Self Care | Payer: Medicare Other | Source: Ambulatory Visit | Attending: Internal Medicine | Admitting: Internal Medicine

## 2012-05-13 DIAGNOSIS — Z1231 Encounter for screening mammogram for malignant neoplasm of breast: Secondary | ICD-10-CM

## 2013-04-20 ENCOUNTER — Other Ambulatory Visit (HOSPITAL_COMMUNITY): Payer: Self-pay | Admitting: Internal Medicine

## 2013-04-20 DIAGNOSIS — Z1231 Encounter for screening mammogram for malignant neoplasm of breast: Secondary | ICD-10-CM

## 2013-05-14 ENCOUNTER — Ambulatory Visit (HOSPITAL_COMMUNITY)
Admission: RE | Admit: 2013-05-14 | Discharge: 2013-05-14 | Disposition: A | Discharge status: Home / Self Care | Payer: Medicare Other | Source: Ambulatory Visit | Attending: Internal Medicine | Admitting: Internal Medicine

## 2013-05-14 DIAGNOSIS — Z1231 Encounter for screening mammogram for malignant neoplasm of breast: Secondary | ICD-10-CM

## 2013-09-25 ENCOUNTER — Ambulatory Visit (INDEPENDENT_AMBULATORY_CARE_PROVIDER_SITE_OTHER): Payer: Self-pay

## 2013-09-25 ENCOUNTER — Ambulatory Visit (INDEPENDENT_AMBULATORY_CARE_PROVIDER_SITE_OTHER): Payer: Medicare Other | Admitting: Neurology

## 2013-09-25 DIAGNOSIS — M79609 Pain in unspecified limb: Secondary | ICD-10-CM

## 2013-09-25 DIAGNOSIS — R209 Unspecified disturbances of skin sensation: Secondary | ICD-10-CM

## 2013-09-25 DIAGNOSIS — Z0289 Encounter for other administrative examinations: Secondary | ICD-10-CM

## 2013-09-25 NOTE — Procedures (Signed)
     HISTORY:  Angela Greer is a 62 year old patient with a one-month history of burning discomfort in the hands, right greater than left. The patient denies any significant neck or shoulder discomfort. The patient is being evaluated for possible neuropathy or a cervical radiculopathy.  NERVE CONDUCTION STUDIES:  Nerve conduction studies were performed on both upper extremities. The distal motor latencies and motor amplitudes for the median and ulnar nerves were within normal limits. The F wave latencies and nerve conduction velocities for these nerves were also normal. The sensory latencies for the median and ulnar nerves were normal.   EMG STUDIES:  EMG study was performed on the right upper extremity:  The first dorsal interosseous muscle reveals 2 to 4 K units with full recruitment. No fibrillations or positive waves were noted. The abductor pollicis brevis muscle reveals 2 to 4 K units with full recruitment. No fibrillations or positive waves were noted. The extensor indicis proprius muscle reveals 1 to 3 K units with full recruitment. No fibrillations or positive waves were noted. The pronator teres muscle reveals 2 to 3 K units with full recruitment. No fibrillations or positive waves were noted. The biceps muscle reveals 1 to 2 K units with full recruitment. No fibrillations or positive waves were noted. The triceps muscle reveals 2 to 4 K units with full recruitment. No fibrillations or positive waves were noted. The anterior deltoid muscle reveals 2 to 3 K units with full recruitment. No fibrillations or positive waves were noted. The cervical paraspinal muscles were tested at 2 levels. No abnormalities of insertional activity were seen at either level tested. There was good relaxation.  EMG study was performed on the left upper extremity:  The first dorsal interosseous muscle reveals 2 to 4 K units with full recruitment. No fibrillations or positive waves were noted. The  abductor pollicis brevis muscle reveals 2 to 5 K units with full recruitment. One plus fibrillations and positive waves were noted. The extensor indicis proprius muscle reveals up to 5 K units with moderately reduced recruitment. No fibrillations or positive waves were noted. The pronator teres muscle reveals 2 to 3 K units with full recruitment. No fibrillations or positive waves were noted. The biceps muscle reveals 1 to 2 K units with full recruitment. No fibrillations or positive waves were noted. The triceps muscle reveals 2 to 4 K units with full recruitment. No fibrillations or positive waves were noted. The anterior deltoid muscle reveals 2 to 3 K units with full recruitment. No fibrillations or positive waves were noted. The cervical paraspinal muscles were tested at 2 levels. No abnormalities of insertional activity were seen at either level tested. There was good relaxation.   IMPRESSION:  Nerve conduction studies done on both upper extremities were within normal limits. There is no evidence of carpal tunnel syndrome or evidence of a neuropathy. EMG evaluation of the right upper extremity is unremarkable, without evidence of an overlying cervical radiculopathy. EMG of the left upper extremity shows findings that could be consistent with a primarily chronic C8 radiculopathy.  Marlan Palau MD 09/25/2013 4:21 PM  [facility] [address] [place], [place] [address]  Phone [phone] Fax [phone]

## 2014-04-05 ENCOUNTER — Other Ambulatory Visit (HOSPITAL_COMMUNITY): Payer: Self-pay | Admitting: Internal Medicine

## 2014-04-05 DIAGNOSIS — Z1231 Encounter for screening mammogram for malignant neoplasm of breast: Secondary | ICD-10-CM

## 2014-05-17 ENCOUNTER — Ambulatory Visit (HOSPITAL_COMMUNITY)
Admission: RE | Admit: 2014-05-17 | Discharge: 2014-05-17 | Disposition: A | Discharge status: Home / Self Care | Payer: Medicare Other | Source: Ambulatory Visit | Attending: Internal Medicine | Admitting: Internal Medicine

## 2014-05-17 DIAGNOSIS — Z1231 Encounter for screening mammogram for malignant neoplasm of breast: Secondary | ICD-10-CM | POA: Insufficient documentation

## 2014-11-08 DIAGNOSIS — J328 Other chronic sinusitis: Secondary | ICD-10-CM | POA: Diagnosis not present

## 2014-12-06 DIAGNOSIS — D72829 Elevated white blood cell count, unspecified: Secondary | ICD-10-CM | POA: Diagnosis not present

## 2014-12-06 DIAGNOSIS — R1030 Lower abdominal pain, unspecified: Secondary | ICD-10-CM | POA: Diagnosis not present

## 2014-12-06 DIAGNOSIS — K5730 Diverticulosis of large intestine without perforation or abscess without bleeding: Secondary | ICD-10-CM | POA: Diagnosis not present

## 2014-12-06 DIAGNOSIS — K5790 Diverticulosis of intestine, part unspecified, without perforation or abscess without bleeding: Secondary | ICD-10-CM | POA: Diagnosis not present

## 2014-12-06 DIAGNOSIS — E876 Hypokalemia: Secondary | ICD-10-CM | POA: Diagnosis not present

## 2014-12-09 DIAGNOSIS — M10072 Idiopathic gout, left ankle and foot: Secondary | ICD-10-CM | POA: Diagnosis not present

## 2014-12-09 DIAGNOSIS — R7301 Impaired fasting glucose: Secondary | ICD-10-CM | POA: Diagnosis not present

## 2014-12-09 DIAGNOSIS — I10 Essential (primary) hypertension: Secondary | ICD-10-CM | POA: Diagnosis not present

## 2015-01-12 DIAGNOSIS — M10072 Idiopathic gout, left ankle and foot: Secondary | ICD-10-CM | POA: Diagnosis not present

## 2015-01-12 DIAGNOSIS — Z8249 Family history of ischemic heart disease and other diseases of the circulatory system: Secondary | ICD-10-CM | POA: Diagnosis not present

## 2015-01-12 DIAGNOSIS — I10 Essential (primary) hypertension: Secondary | ICD-10-CM | POA: Diagnosis not present

## 2015-01-24 DIAGNOSIS — E782 Mixed hyperlipidemia: Secondary | ICD-10-CM | POA: Diagnosis not present

## 2015-01-24 DIAGNOSIS — M10072 Idiopathic gout, left ankle and foot: Secondary | ICD-10-CM | POA: Diagnosis not present

## 2015-01-24 DIAGNOSIS — Z8249 Family history of ischemic heart disease and other diseases of the circulatory system: Secondary | ICD-10-CM | POA: Diagnosis not present

## 2015-01-31 DIAGNOSIS — E782 Mixed hyperlipidemia: Secondary | ICD-10-CM | POA: Diagnosis not present

## 2015-01-31 DIAGNOSIS — I10 Essential (primary) hypertension: Secondary | ICD-10-CM | POA: Diagnosis not present

## 2015-01-31 DIAGNOSIS — M10072 Idiopathic gout, left ankle and foot: Secondary | ICD-10-CM | POA: Diagnosis not present

## 2015-01-31 DIAGNOSIS — R7301 Impaired fasting glucose: Secondary | ICD-10-CM | POA: Diagnosis not present

## 2015-03-18 DIAGNOSIS — H04123 Dry eye syndrome of bilateral lacrimal glands: Secondary | ICD-10-CM | POA: Diagnosis not present

## 2015-03-18 DIAGNOSIS — H25811 Combined forms of age-related cataract, right eye: Secondary | ICD-10-CM | POA: Diagnosis not present

## 2015-03-18 DIAGNOSIS — H25813 Combined forms of age-related cataract, bilateral: Secondary | ICD-10-CM | POA: Diagnosis not present

## 2015-03-18 DIAGNOSIS — H10413 Chronic giant papillary conjunctivitis, bilateral: Secondary | ICD-10-CM | POA: Diagnosis not present

## 2015-03-18 DIAGNOSIS — H40013 Open angle with borderline findings, low risk, bilateral: Secondary | ICD-10-CM | POA: Diagnosis not present

## 2015-04-11 DIAGNOSIS — H268 Other specified cataract: Secondary | ICD-10-CM | POA: Diagnosis not present

## 2015-04-11 DIAGNOSIS — H2511 Age-related nuclear cataract, right eye: Secondary | ICD-10-CM | POA: Diagnosis not present

## 2015-04-22 DIAGNOSIS — H2512 Age-related nuclear cataract, left eye: Secondary | ICD-10-CM | POA: Diagnosis not present

## 2015-05-02 DIAGNOSIS — H25812 Combined forms of age-related cataract, left eye: Secondary | ICD-10-CM | POA: Diagnosis not present

## 2015-05-02 DIAGNOSIS — H2512 Age-related nuclear cataract, left eye: Secondary | ICD-10-CM | POA: Diagnosis not present

## 2015-05-04 ENCOUNTER — Other Ambulatory Visit (HOSPITAL_COMMUNITY): Payer: Self-pay | Admitting: Internal Medicine

## 2015-05-04 DIAGNOSIS — Z1231 Encounter for screening mammogram for malignant neoplasm of breast: Secondary | ICD-10-CM

## 2015-05-20 ENCOUNTER — Ambulatory Visit (HOSPITAL_COMMUNITY)
Admission: RE | Admit: 2015-05-20 | Discharge: 2015-05-20 | Disposition: A | Discharge status: Home / Self Care | Payer: Medicare Other | Source: Ambulatory Visit | Attending: Internal Medicine | Admitting: Internal Medicine

## 2015-05-20 DIAGNOSIS — Z1231 Encounter for screening mammogram for malignant neoplasm of breast: Secondary | ICD-10-CM

## 2015-06-13 DIAGNOSIS — T8522XA Displacement of intraocular lens, initial encounter: Secondary | ICD-10-CM | POA: Diagnosis not present

## 2015-06-15 DIAGNOSIS — Z1389 Encounter for screening for other disorder: Secondary | ICD-10-CM | POA: Diagnosis not present

## 2015-06-15 DIAGNOSIS — E782 Mixed hyperlipidemia: Secondary | ICD-10-CM | POA: Diagnosis not present

## 2015-06-15 DIAGNOSIS — E6609 Other obesity due to excess calories: Secondary | ICD-10-CM | POA: Diagnosis not present

## 2015-06-15 DIAGNOSIS — Z780 Asymptomatic menopausal state: Secondary | ICD-10-CM | POA: Diagnosis not present

## 2015-06-15 DIAGNOSIS — R319 Hematuria, unspecified: Secondary | ICD-10-CM | POA: Diagnosis not present

## 2015-06-15 DIAGNOSIS — M150 Primary generalized (osteo)arthritis: Secondary | ICD-10-CM | POA: Diagnosis not present

## 2015-06-15 DIAGNOSIS — I10 Essential (primary) hypertension: Secondary | ICD-10-CM | POA: Diagnosis not present

## 2015-06-22 DIAGNOSIS — Z0000 Encounter for general adult medical examination without abnormal findings: Secondary | ICD-10-CM | POA: Diagnosis not present

## 2015-06-22 DIAGNOSIS — I10 Essential (primary) hypertension: Secondary | ICD-10-CM | POA: Diagnosis not present

## 2015-06-22 DIAGNOSIS — M1A0720 Idiopathic chronic gout, left ankle and foot, without tophus (tophi): Secondary | ICD-10-CM | POA: Diagnosis not present

## 2015-06-22 DIAGNOSIS — E782 Mixed hyperlipidemia: Secondary | ICD-10-CM | POA: Diagnosis not present

## 2015-07-13 DIAGNOSIS — K5730 Diverticulosis of large intestine without perforation or abscess without bleeding: Secondary | ICD-10-CM | POA: Diagnosis not present

## 2015-07-13 DIAGNOSIS — N3000 Acute cystitis without hematuria: Secondary | ICD-10-CM | POA: Diagnosis not present

## 2015-07-13 DIAGNOSIS — I10 Essential (primary) hypertension: Secondary | ICD-10-CM | POA: Diagnosis not present

## 2015-07-13 DIAGNOSIS — F411 Generalized anxiety disorder: Secondary | ICD-10-CM | POA: Diagnosis not present

## 2015-09-06 DIAGNOSIS — H10413 Chronic giant papillary conjunctivitis, bilateral: Secondary | ICD-10-CM | POA: Diagnosis not present

## 2015-09-06 DIAGNOSIS — H11823 Conjunctivochalasis, bilateral: Secondary | ICD-10-CM | POA: Diagnosis not present

## 2015-09-20 DIAGNOSIS — H11823 Conjunctivochalasis, bilateral: Secondary | ICD-10-CM | POA: Diagnosis not present

## 2015-09-20 DIAGNOSIS — Z961 Presence of intraocular lens: Secondary | ICD-10-CM | POA: Diagnosis not present

## 2015-10-05 DIAGNOSIS — E782 Mixed hyperlipidemia: Secondary | ICD-10-CM | POA: Diagnosis not present

## 2015-10-05 DIAGNOSIS — R5383 Other fatigue: Secondary | ICD-10-CM | POA: Diagnosis not present

## 2015-10-05 DIAGNOSIS — R7301 Impaired fasting glucose: Secondary | ICD-10-CM | POA: Diagnosis not present

## 2015-10-05 DIAGNOSIS — M1A0720 Idiopathic chronic gout, left ankle and foot, without tophus (tophi): Secondary | ICD-10-CM | POA: Diagnosis not present

## 2015-10-12 DIAGNOSIS — M1A0720 Idiopathic chronic gout, left ankle and foot, without tophus (tophi): Secondary | ICD-10-CM | POA: Diagnosis not present

## 2015-10-12 DIAGNOSIS — E782 Mixed hyperlipidemia: Secondary | ICD-10-CM | POA: Diagnosis not present

## 2015-10-12 DIAGNOSIS — R7301 Impaired fasting glucose: Secondary | ICD-10-CM | POA: Diagnosis not present

## 2015-10-12 DIAGNOSIS — I10 Essential (primary) hypertension: Secondary | ICD-10-CM | POA: Diagnosis not present

## 2015-10-14 DIAGNOSIS — N3020 Other chronic cystitis without hematuria: Secondary | ICD-10-CM | POA: Diagnosis not present

## 2015-12-13 DIAGNOSIS — M10071 Idiopathic gout, right ankle and foot: Secondary | ICD-10-CM | POA: Diagnosis not present

## 2016-02-08 DIAGNOSIS — I10 Essential (primary) hypertension: Secondary | ICD-10-CM | POA: Diagnosis not present

## 2016-02-08 DIAGNOSIS — M1A0720 Idiopathic chronic gout, left ankle and foot, without tophus (tophi): Secondary | ICD-10-CM | POA: Diagnosis not present

## 2016-02-08 DIAGNOSIS — R7301 Impaired fasting glucose: Secondary | ICD-10-CM | POA: Diagnosis not present

## 2016-02-08 DIAGNOSIS — E782 Mixed hyperlipidemia: Secondary | ICD-10-CM | POA: Diagnosis not present

## 2016-02-15 DIAGNOSIS — E782 Mixed hyperlipidemia: Secondary | ICD-10-CM | POA: Diagnosis not present

## 2016-02-15 DIAGNOSIS — R7301 Impaired fasting glucose: Secondary | ICD-10-CM | POA: Diagnosis not present

## 2016-02-15 DIAGNOSIS — M10072 Idiopathic gout, left ankle and foot: Secondary | ICD-10-CM | POA: Diagnosis not present

## 2016-02-15 DIAGNOSIS — I10 Essential (primary) hypertension: Secondary | ICD-10-CM | POA: Diagnosis not present

## 2016-02-23 DIAGNOSIS — Z961 Presence of intraocular lens: Secondary | ICD-10-CM | POA: Diagnosis not present

## 2016-02-23 DIAGNOSIS — H26493 Other secondary cataract, bilateral: Secondary | ICD-10-CM | POA: Diagnosis not present

## 2016-02-23 DIAGNOSIS — H40013 Open angle with borderline findings, low risk, bilateral: Secondary | ICD-10-CM | POA: Diagnosis not present

## 2016-04-16 ENCOUNTER — Other Ambulatory Visit: Payer: Self-pay | Admitting: Internal Medicine

## 2016-04-16 DIAGNOSIS — Z1231 Encounter for screening mammogram for malignant neoplasm of breast: Secondary | ICD-10-CM

## 2016-05-19 ENCOUNTER — Ambulatory Visit (HOSPITAL_COMMUNITY): Payer: Medicare Other

## 2016-05-19 ENCOUNTER — Ambulatory Visit (HOSPITAL_COMMUNITY)
Admission: EM | Admit: 2016-05-19 | Discharge: 2016-05-19 | Disposition: A | Discharge status: Home / Self Care | Payer: Medicare Other | Attending: Emergency Medicine | Admitting: Emergency Medicine

## 2016-05-19 ENCOUNTER — Encounter (HOSPITAL_COMMUNITY): Payer: Self-pay | Admitting: Emergency Medicine

## 2016-05-19 DIAGNOSIS — K219 Gastro-esophageal reflux disease without esophagitis: Secondary | ICD-10-CM | POA: Diagnosis not present

## 2016-05-19 DIAGNOSIS — M533 Sacrococcygeal disorders, not elsewhere classified: Secondary | ICD-10-CM | POA: Diagnosis not present

## 2016-05-19 DIAGNOSIS — I10 Essential (primary) hypertension: Secondary | ICD-10-CM | POA: Insufficient documentation

## 2016-05-19 DIAGNOSIS — W19XXXA Unspecified fall, initial encounter: Secondary | ICD-10-CM | POA: Insufficient documentation

## 2016-05-19 DIAGNOSIS — S0003XA Contusion of scalp, initial encounter: Secondary | ICD-10-CM | POA: Diagnosis not present

## 2016-05-19 DIAGNOSIS — R51 Headache: Secondary | ICD-10-CM | POA: Diagnosis present

## 2016-05-19 DIAGNOSIS — M109 Gout, unspecified: Secondary | ICD-10-CM | POA: Diagnosis not present

## 2016-05-19 DIAGNOSIS — S3992XA Unspecified injury of lower back, initial encounter: Secondary | ICD-10-CM | POA: Diagnosis not present

## 2016-05-19 HISTORY — DX: Gastro-esophageal reflux disease without esophagitis: K21.9

## 2016-05-19 HISTORY — DX: Essential (primary) hypertension: I10

## 2016-05-19 HISTORY — DX: Gout, unspecified: M10.9

## 2016-05-19 MED ORDER — TRAMADOL HCL 50 MG PO TABS: 50.0000 mg | ORAL_TABLET | Freq: Four times a day (QID) | ORAL | Status: DC | PRN

## 2016-05-19 NOTE — ED Notes (Signed)
Patient fell on steps today.  Right foot went through third step, patient then fell backwards, landing on buttocks and then left side of head.  No loc.  Patient has a tender hematoma to left side of head.  Right lower leg with abrasions.  Patient reports tailbone is sore

## 2016-05-19 NOTE — Discharge Instructions (Signed)
Your x-ray is normal. You have a hematoma on your scalp. Apply ice as often as you can. You have also likely bruised your tailbone. Get a doughnut pillow to sit on for the next several days. Alternate Tylenol and ibuprofen to help with pain. Use the tramadol every 6-8 hours as needed for severe pain. Follow-up with your PCP as needed.

## 2016-05-19 NOTE — ED Provider Notes (Signed)
CSN: [id number]     Arrival date & time 05/19/16  1923 History   First MD Initiated Contact with Patient 05/19/16 2114     Chief Complaint  Patient presents with  . Fall   (Consider location/radiation/quality/duration/timing/severity/associated sxs/prior Treatment) HPI She is a 65 year old woman here for evaluation of head injury and tailbone injury after a fall. She was going up some stairs when her right foot went through a step causing her to fall backwards. She reports hitting her tailbone and right posterior head on the ground. She has some pain and swelling in her head where she hit as well as a more generalized headache. She denies any loss of consciousness. She is not on any blood thinners. No vision changes, dizziness, nausea, or vomiting. No focal neurologic deficits. She also reports pain in her tailbone. This is worse with sitting and palpation.  She has a scrape on her right upper arm as well as the right lower leg, but denies any extremity pain.  Past Medical History  Diagnosis Date  . Hypertension   . Acid reflux   . Gout    Past Surgical History  Procedure Laterality Date  . Total hip arthroplasty Bilateral    No family history on file. Social History  Substance Use Topics  . Smoking status: Never Smoker   . Smokeless tobacco: None  . Alcohol Use: No   OB History    No data available     Review of Systems As in history of present illness Allergies  Pravastatin sodium and Simvastatin  Home Medications   Prior to Admission medications   Medication Sig Start Date End Date Taking? Authorizing Provider  OVER THE COUNTER MEDICATION Unable to remember names of medicines and medicines are not with her.   Blood pressure medicine Gout medicine Acid reflux   Yes Historical Provider, MD  traMADol (ULTRAM) 50 MG tablet Take 1 tablet (50 mg total) by mouth every 6 (six) hours as needed. 05/19/16   Charm Rings, MD   Meds Ordered and Administered this Visit    Medications - No data to display  There were no vitals taken for this visit. No data found.   Physical Exam  Constitutional: She is oriented to person, place, and time. She appears well-developed and well-nourished. No distress.  HENT:  Head:    Eyes: EOM are normal. Pupils are equal, round, and reactive to light.  Neck: Neck supple.  Cardiovascular: Normal rate.   Pulmonary/Chest: Effort normal.  Musculoskeletal:  Back: No erythema or edema. No lumbar tenderness. She is quite tender over the coccyx.  Neurological: She is alert and oriented to person, place, and time. Coordination normal.  Normal gait  Skin:  Superficial linear abrasion to right anterior shin. She has another superficial abrasion to the underside of her right upper arm.    ED Course  Procedures (including critical care time)  Labs Review Labs Reviewed - No data to display  Imaging Review Dg Sacrum/coccyx  05/19/2016  CLINICAL DATA:  65 year old female with fall and trauma to the buttock area. EXAM: SACRUM AND COCCYX - 2+ VIEW COMPARISON:  Pelvic radiograph dated 02/07/2005 FINDINGS: There is no acute fracture or dislocation. There is osteopenia with degenerative changes of the spine. Bilateral hip arthroplasty noted. The soft tissues appear unremarkable with IMPRESSION: No acute fracture or dislocation Electronically Signed   By: Elgie Collard M.D.   On: 05/19/2016 21:54     MDM   1. Scalp hematoma, initial encounter  2. Coccydynia    X-ray negative. No sign of intracranial injury. Wound care for abrasions discussed. Conservative management of scalp hematoma and tailbone pain with ice and ibuprofen. Follow-up as needed.   Charm RingsErin J Liberta Gimpel, MD 05/19/16 2209

## 2016-05-21 ENCOUNTER — Ambulatory Visit: Payer: Self-pay

## 2016-05-25 ENCOUNTER — Ambulatory Visit
Admission: RE | Admit: 2016-05-25 | Discharge: 2016-05-25 | Disposition: A | Discharge status: Home / Self Care | Payer: Medicare Other | Source: Ambulatory Visit | Attending: Internal Medicine | Admitting: Internal Medicine

## 2016-05-25 DIAGNOSIS — Z1231 Encounter for screening mammogram for malignant neoplasm of breast: Secondary | ICD-10-CM

## 2016-05-29 DIAGNOSIS — M533 Sacrococcygeal disorders, not elsewhere classified: Secondary | ICD-10-CM | POA: Diagnosis not present

## 2016-05-29 DIAGNOSIS — M791 Myalgia: Secondary | ICD-10-CM | POA: Diagnosis not present

## 2016-07-04 DIAGNOSIS — Z780 Asymptomatic menopausal state: Secondary | ICD-10-CM | POA: Diagnosis not present

## 2016-07-04 DIAGNOSIS — M10072 Idiopathic gout, left ankle and foot: Secondary | ICD-10-CM | POA: Diagnosis not present

## 2016-07-04 DIAGNOSIS — R7301 Impaired fasting glucose: Secondary | ICD-10-CM | POA: Diagnosis not present

## 2016-07-04 DIAGNOSIS — Z23 Encounter for immunization: Secondary | ICD-10-CM | POA: Diagnosis not present

## 2016-07-04 DIAGNOSIS — Z0000 Encounter for general adult medical examination without abnormal findings: Secondary | ICD-10-CM | POA: Diagnosis not present

## 2016-07-04 DIAGNOSIS — E782 Mixed hyperlipidemia: Secondary | ICD-10-CM | POA: Diagnosis not present

## 2016-07-04 DIAGNOSIS — N390 Urinary tract infection, site not specified: Secondary | ICD-10-CM | POA: Diagnosis not present

## 2016-07-04 DIAGNOSIS — I10 Essential (primary) hypertension: Secondary | ICD-10-CM | POA: Diagnosis not present

## 2016-07-11 DIAGNOSIS — K219 Gastro-esophageal reflux disease without esophagitis: Secondary | ICD-10-CM | POA: Diagnosis not present

## 2016-07-11 DIAGNOSIS — I10 Essential (primary) hypertension: Secondary | ICD-10-CM | POA: Diagnosis not present

## 2016-07-11 DIAGNOSIS — Z23 Encounter for immunization: Secondary | ICD-10-CM | POA: Diagnosis not present

## 2016-07-11 DIAGNOSIS — R7303 Prediabetes: Secondary | ICD-10-CM | POA: Diagnosis not present

## 2016-07-11 DIAGNOSIS — E782 Mixed hyperlipidemia: Secondary | ICD-10-CM | POA: Diagnosis not present

## 2016-09-17 DIAGNOSIS — J069 Acute upper respiratory infection, unspecified: Secondary | ICD-10-CM | POA: Diagnosis not present

## 2016-11-13 DIAGNOSIS — Z961 Presence of intraocular lens: Secondary | ICD-10-CM | POA: Diagnosis not present

## 2016-11-13 DIAGNOSIS — H26491 Other secondary cataract, right eye: Secondary | ICD-10-CM | POA: Diagnosis not present

## 2017-01-09 DIAGNOSIS — E782 Mixed hyperlipidemia: Secondary | ICD-10-CM | POA: Diagnosis not present

## 2017-01-09 DIAGNOSIS — R7303 Prediabetes: Secondary | ICD-10-CM | POA: Diagnosis not present

## 2017-01-16 DIAGNOSIS — K219 Gastro-esophageal reflux disease without esophagitis: Secondary | ICD-10-CM | POA: Diagnosis not present

## 2017-01-16 DIAGNOSIS — I10 Essential (primary) hypertension: Secondary | ICD-10-CM | POA: Diagnosis not present

## 2017-01-16 DIAGNOSIS — R7303 Prediabetes: Secondary | ICD-10-CM | POA: Diagnosis not present

## 2017-01-16 DIAGNOSIS — E782 Mixed hyperlipidemia: Secondary | ICD-10-CM | POA: Diagnosis not present

## 2017-02-28 DIAGNOSIS — H40013 Open angle with borderline findings, low risk, bilateral: Secondary | ICD-10-CM | POA: Diagnosis not present

## 2017-02-28 DIAGNOSIS — Z961 Presence of intraocular lens: Secondary | ICD-10-CM | POA: Diagnosis not present

## 2017-03-12 DIAGNOSIS — K5792 Diverticulitis of intestine, part unspecified, without perforation or abscess without bleeding: Secondary | ICD-10-CM | POA: Diagnosis not present

## 2017-04-01 ENCOUNTER — Encounter (HOSPITAL_COMMUNITY): Payer: Self-pay | Admitting: Family Medicine

## 2017-04-01 DIAGNOSIS — Z87891 Personal history of nicotine dependence: Secondary | ICD-10-CM | POA: Diagnosis not present

## 2017-04-01 DIAGNOSIS — Z96643 Presence of artificial hip joint, bilateral: Secondary | ICD-10-CM | POA: Insufficient documentation

## 2017-04-01 DIAGNOSIS — K625 Hemorrhage of anus and rectum: Secondary | ICD-10-CM | POA: Diagnosis not present

## 2017-04-01 DIAGNOSIS — K602 Anal fissure, unspecified: Secondary | ICD-10-CM | POA: Insufficient documentation

## 2017-04-01 DIAGNOSIS — I10 Essential (primary) hypertension: Secondary | ICD-10-CM | POA: Insufficient documentation

## 2017-04-01 LAB — COMPREHENSIVE METABOLIC PANEL
ALBUMIN: 4 g/dL (ref 3.5–5.0)
ALK PHOS: 100 U/L (ref 38–126)
ALT: 31 U/L (ref 14–54)
AST: 27 U/L (ref 15–41)
Anion gap: 10 (ref 5–15)
BILIRUBIN TOTAL: 0.5 mg/dL (ref 0.3–1.2)
BUN: 15 mg/dL (ref 6–20)
CALCIUM: 9.8 mg/dL (ref 8.9–10.3)
CO2: 26 mmol/L (ref 22–32)
CREATININE: 1.14 mg/dL — AB (ref 0.44–1.00)
Chloride: 101 mmol/L (ref 101–111)
GFR calc Af Amer: 57 mL/min — ABNORMAL LOW (ref >60)
GFR, EST NON AFRICAN AMERICAN: 49 mL/min — AB (ref >60)
GLUCOSE: 113 mg/dL — AB (ref 65–99)
Potassium: 2.9 mmol/L — ABNORMAL LOW (ref 3.5–5.1)
Sodium: 137 mmol/L (ref 135–145)
TOTAL PROTEIN: 7.3 g/dL (ref 6.5–8.1)

## 2017-04-01 LAB — CBC
HEMATOCRIT: 45.5 % (ref 36.0–46.0)
Hemoglobin: 15.6 g/dL — ABNORMAL HIGH (ref 12.0–15.0)
MCH: 30.7 pg (ref 26.0–34.0)
MCHC: 34.3 g/dL (ref 30.0–36.0)
MCV: 89.6 fL (ref 78.0–100.0)
PLATELETS: 200 10*3/uL (ref 150–400)
RBC: 5.08 MIL/uL (ref 3.87–5.11)
RDW: 14.2 % (ref 11.5–15.5)
WBC: 11.6 10*3/uL — AB (ref 4.0–10.5)

## 2017-04-01 NOTE — ED Triage Notes (Signed)
Patient is complaining of bright red, rectal bleeding that started earlier this afternoon. Also, recently had urinary symptoms of discomfort after urinating and blood in urine.

## 2017-04-02 ENCOUNTER — Encounter (HOSPITAL_COMMUNITY): Payer: Self-pay | Admitting: Emergency Medicine

## 2017-04-02 ENCOUNTER — Emergency Department (HOSPITAL_COMMUNITY)
Admission: EM | Admit: 2017-04-02 | Discharge: 2017-04-02 | Disposition: A | Discharge status: Home / Self Care | Payer: Medicare Other | Attending: Emergency Medicine | Admitting: Emergency Medicine

## 2017-04-02 ENCOUNTER — Emergency Department (HOSPITAL_COMMUNITY): Payer: Medicare Other

## 2017-04-02 DIAGNOSIS — K602 Anal fissure, unspecified: Secondary | ICD-10-CM

## 2017-04-02 DIAGNOSIS — K625 Hemorrhage of anus and rectum: Secondary | ICD-10-CM | POA: Diagnosis not present

## 2017-04-02 HISTORY — DX: Diverticulitis of intestine, part unspecified, without perforation or abscess without bleeding: K57.92

## 2017-04-02 LAB — TYPE AND SCREEN
ABO/RH(D): A POS
ANTIBODY SCREEN: NEGATIVE

## 2017-04-02 LAB — ABO/RH: ABO/RH(D): A POS

## 2017-04-02 LAB — POC OCCULT BLOOD, ED: Fecal Occult Bld: POSITIVE — AB

## 2017-04-02 NOTE — ED Notes (Signed)
ED Provider at bedside. 

## 2017-04-02 NOTE — ED Provider Notes (Signed)
[facility]-EMERGENCY DEPT Provider Note   CSN: [id number] Arrival date & time: 04/01/17  2155  By signing my name below, I, Doreatha Martin, attest that this documentation has been prepared under the direction and in the presence of Landon Bassford, MD. Electronically Signed: Doreatha Martin, ED Scribe. 04/02/17. 12:37 AM.     History   Chief Complaint Chief Complaint  Patient presents with  . Rectal Bleeding    HPI Angela Greer is a 66 y.o. female with h/o diverticulitis who presents to the Emergency Department complaining of intermittent bright red rectal bleeding that began last night. Pt was started on Cipro a few days ago for UTI. She states these symptoms improved but her dysuria persisted. Per pt, she began to notice rectal bleeding when she wiped after using the rest room and a small amount in her BM last night. No h/o hemorrhoids. Pt states she has not been straining to have bowel movements. She denies additional complaints.    The history is provided by the patient. No language interpreter was used.  Rectal Bleeding  Quality:  Bright red Amount:  Scant Duration:  1 day Timing:  Intermittent Chronicity:  New Context: not hemorrhoids   Similar prior episodes: no   Relieved by:  Nothing Worsened by:  Wiping and defecation Ineffective treatments:  None tried Associated symptoms: recent illness   Associated symptoms: no fever     Past Medical History:  Diagnosis Date  . Acid reflux   . Diverticulitis   . Gout   . Hypertension     Patient Active Problem List   Diagnosis Date Noted  . HYPERLIPIDEMIA, MIXED 05/17/2007  . OBESITY 05/17/2007  . ULNAR NEUROPATHY, RIGHT 05/17/2007  . ALLERGIC RHINITIS 05/17/2007  . GASTROESOPHAGEAL REFLUX DISEASE 05/17/2007  . HIATAL HERNIA 05/17/2007  . CYSTITIS NOS 05/17/2007  . DEGENERATIVE JOINT DISEASE, HIPS 05/17/2007  . DUPUYTREN'S CONTRACTURE, RIGHT 05/17/2007  . AVASCULAR NECROSIS, FEMORAL HEAD 05/17/2007  . ABNORMAL PAP SMEAR  11/05/1988    Past Surgical History:  Procedure Laterality Date  . TOTAL HIP ARTHROPLASTY Bilateral     OB History    No data available       Home Medications    Prior to Admission medications   Medication Sig Start Date End Date Taking? Authorizing Provider  OVER THE COUNTER MEDICATION Unable to remember names of medicines and medicines are not with her.   Blood pressure medicine Gout medicine Acid reflux    [provider]  traMADol (ULTRAM) 50 MG tablet Take 1 tablet (50 mg total) by mouth every 6 (six) hours as needed. 05/19/16   Charm Rings, MD    Family History History reviewed. No pertinent family history.  Social History Social History  Substance Use Topics  . Smoking status: Former Games developer  . Smokeless tobacco: Never Used  . Alcohol use No     Allergies   Pravastatin sodium and Simvastatin   Review of Systems Review of Systems  Constitutional: Negative for appetite change, chills and fever.  HENT: Negative for drooling and facial swelling.   Eyes: Negative for photophobia.  Respiratory: Negative for shortness of breath.   Cardiovascular: Negative for chest pain, palpitations and leg swelling.  Gastrointestinal: Positive for hematochezia. Negative for anal bleeding.  Genitourinary: Positive for dysuria. Negative for difficulty urinating.  Musculoskeletal: Negative for neck stiffness.  Skin: Negative for pallor.  Neurological: Negative for facial asymmetry and speech difficulty.  Psychiatric/Behavioral: Negative for suicidal ideas.  All other systems reviewed and are  negative.    Physical Exam Updated Vital Signs BP 132/78 (BP Location: Left Arm)   Pulse 64   Temp 98 F (36.7 C) (Oral)   Resp 12   Ht 5\' 4"  (1.626 m)   Wt 202 lb (91.6 kg)   SpO2 100%   BMI 34.67 kg/m   Physical Exam  Constitutional: She is oriented to person, place, and time. She appears well-developed and well-nourished.  HENT:  Head: Normocephalic and  atraumatic.  Mouth/Throat: Oropharynx is clear and moist. No oropharyngeal exudate.  Moist mucous membranes. No exudates.   Eyes: Conjunctivae and EOM are normal. Pupils are equal, round, and reactive to light.  Neck: Normal range of motion. Neck supple. No JVD present. No tracheal deviation present.  No carotid bruits. Trachea midline.   Cardiovascular: Normal rate, regular rhythm, normal heart sounds and intact distal pulses.  Exam reveals no gallop and no friction rub.   No murmur heard. RRR.   Pulmonary/Chest: Effort normal and breath sounds normal. No stridor. No respiratory distress. She has no wheezes. She has no rales.  Lungs CTA bilaterally.   Abdominal: Soft. Bowel sounds are normal. She exhibits no distension. There is no rebound and no guarding.  Genitourinary:  Genitourinary Comments: Chaperone present throughout entire exam.  No gross blood. Brown stool in the rectal vault. Early non-thrombosed external hemorrhoid noted with anal fissure 6 o clock position. Excoriation around her anal sphincter.    Musculoskeletal: Normal range of motion.  Lymphadenopathy:    She has no cervical adenopathy.  Neurological: She is alert and oriented to person, place, and time. She has normal reflexes. She displays normal reflexes.  Skin: Skin is warm and dry.  Psychiatric: She has a normal mood and affect.  Nursing note and vitals reviewed.    ED Treatments / Results   DIAGNOSTIC STUDIES: Oxygen Saturation is 98% on RA, normal by my interpretation.    COORDINATION OF CARE: 12:34 AM Discussed treatment plan with pt at bedside which includes labs and pt agreed to plan.    Labs (all labs ordered are listed, but only abnormal results are displayed) Labs Reviewed  COMPREHENSIVE METABOLIC PANEL - Abnormal; Notable for the following:       Result Value   Potassium 2.9 (*)    Glucose, Bld 113 (*)    Creatinine, Ser 1.14 (*)    GFR calc non Af Amer 49 (*)    GFR calc Af Amer 57 (*)     All other components within normal limits  CBC - Abnormal; Notable for the following:    WBC 11.6 (*)    Hemoglobin 15.6 (*)    All other components within normal limits  POC OCCULT BLOOD, ED  TYPE AND SCREEN    Radiology No results found.  Procedures Procedures (including critical care time)    Final Clinical Impressions(s) / ED Diagnoses  Bleeding without drop in blood count, with fissure and hemorrhoid and normal colored stool and no further episodes in the ED.  Strict return precautions given.  Has had loose stool and has been wiping a lot advised baby wipes.  Non tender on exam.  Doubt UGIB or diverticular bleed.   Return immediately for fever >101, altered level of consciousness,abdominal pain, bleeding or any concerns. Follow up with your own doctor for ongoing concerns.   The patient is nontoxic-appearing on exam and vital signs are within normal limits.   I have reviewed the triage vital signs and the nursing notes. Pertinent labs &  imaging results that were available during my care of the patient were reviewed by me and considered in my medical decision making (see chart for details).  After history, exam, and medical workup I feel the patient has been appropriately medically screened and is safe for discharge home. Pertinent diagnoses were discussed with the patient. Patient was given return precautions.   I personally performed the services described in this documentation, which was scribed in my presence. The recorded information has been reviewed and is accurate.         Annia Gomm, MD 04/02/17 [phone]

## 2017-04-05 DIAGNOSIS — N390 Urinary tract infection, site not specified: Secondary | ICD-10-CM | POA: Diagnosis not present

## 2017-04-05 DIAGNOSIS — K625 Hemorrhage of anus and rectum: Secondary | ICD-10-CM | POA: Diagnosis not present

## 2017-04-12 DIAGNOSIS — K625 Hemorrhage of anus and rectum: Secondary | ICD-10-CM | POA: Diagnosis not present

## 2017-04-15 ENCOUNTER — Other Ambulatory Visit: Payer: Self-pay | Admitting: Internal Medicine

## 2017-04-15 DIAGNOSIS — Z1231 Encounter for screening mammogram for malignant neoplasm of breast: Secondary | ICD-10-CM

## 2017-05-13 DIAGNOSIS — K648 Other hemorrhoids: Secondary | ICD-10-CM | POA: Diagnosis not present

## 2017-05-13 DIAGNOSIS — K5730 Diverticulosis of large intestine without perforation or abscess without bleeding: Secondary | ICD-10-CM | POA: Diagnosis not present

## 2017-05-13 DIAGNOSIS — K635 Polyp of colon: Secondary | ICD-10-CM | POA: Diagnosis not present

## 2017-05-13 DIAGNOSIS — K625 Hemorrhage of anus and rectum: Secondary | ICD-10-CM | POA: Diagnosis not present

## 2017-05-15 DIAGNOSIS — K635 Polyp of colon: Secondary | ICD-10-CM | POA: Diagnosis not present

## 2017-05-27 ENCOUNTER — Ambulatory Visit
Admission: RE | Admit: 2017-05-27 | Discharge: 2017-05-27 | Disposition: A | Discharge status: Home / Self Care | Payer: Medicare Other | Source: Ambulatory Visit | Attending: Internal Medicine | Admitting: Internal Medicine

## 2017-05-27 DIAGNOSIS — Z1231 Encounter for screening mammogram for malignant neoplasm of breast: Secondary | ICD-10-CM | POA: Diagnosis not present

## 2017-05-29 ENCOUNTER — Other Ambulatory Visit: Payer: Self-pay | Admitting: Internal Medicine

## 2017-05-29 DIAGNOSIS — R928 Other abnormal and inconclusive findings on diagnostic imaging of breast: Secondary | ICD-10-CM

## 2017-06-04 ENCOUNTER — Ambulatory Visit
Admission: RE | Admit: 2017-06-04 | Discharge: 2017-06-04 | Disposition: A | Discharge status: Home / Self Care | Payer: Medicare Other | Source: Ambulatory Visit | Attending: Internal Medicine | Admitting: Internal Medicine

## 2017-06-04 ENCOUNTER — Other Ambulatory Visit: Payer: Self-pay | Admitting: Internal Medicine

## 2017-06-04 DIAGNOSIS — R928 Other abnormal and inconclusive findings on diagnostic imaging of breast: Secondary | ICD-10-CM

## 2017-06-04 DIAGNOSIS — N6001 Solitary cyst of right breast: Secondary | ICD-10-CM | POA: Diagnosis not present

## 2017-06-04 DIAGNOSIS — R922 Inconclusive mammogram: Secondary | ICD-10-CM | POA: Diagnosis not present

## 2017-06-04 DIAGNOSIS — N6002 Solitary cyst of left breast: Secondary | ICD-10-CM | POA: Diagnosis not present

## 2017-06-14 ENCOUNTER — Other Ambulatory Visit: Payer: Self-pay | Admitting: Internal Medicine

## 2017-06-14 DIAGNOSIS — R921 Mammographic calcification found on diagnostic imaging of breast: Secondary | ICD-10-CM

## 2017-07-24 DIAGNOSIS — E782 Mixed hyperlipidemia: Secondary | ICD-10-CM | POA: Diagnosis not present

## 2017-07-24 DIAGNOSIS — Z0000 Encounter for general adult medical examination without abnormal findings: Secondary | ICD-10-CM | POA: Diagnosis not present

## 2017-07-24 DIAGNOSIS — Z23 Encounter for immunization: Secondary | ICD-10-CM | POA: Diagnosis not present

## 2017-07-24 DIAGNOSIS — R7303 Prediabetes: Secondary | ICD-10-CM | POA: Diagnosis not present

## 2017-07-24 DIAGNOSIS — N390 Urinary tract infection, site not specified: Secondary | ICD-10-CM | POA: Diagnosis not present

## 2017-07-24 DIAGNOSIS — E039 Hypothyroidism, unspecified: Secondary | ICD-10-CM | POA: Diagnosis not present

## 2017-09-05 DIAGNOSIS — M150 Primary generalized (osteo)arthritis: Secondary | ICD-10-CM | POA: Diagnosis not present

## 2017-09-05 DIAGNOSIS — R7303 Prediabetes: Secondary | ICD-10-CM | POA: Diagnosis not present

## 2017-09-05 DIAGNOSIS — E782 Mixed hyperlipidemia: Secondary | ICD-10-CM | POA: Diagnosis not present

## 2017-09-05 DIAGNOSIS — K5730 Diverticulosis of large intestine without perforation or abscess without bleeding: Secondary | ICD-10-CM | POA: Diagnosis not present

## 2017-09-05 DIAGNOSIS — Z23 Encounter for immunization: Secondary | ICD-10-CM | POA: Diagnosis not present

## 2017-09-17 DIAGNOSIS — R350 Frequency of micturition: Secondary | ICD-10-CM | POA: Diagnosis not present

## 2017-09-17 DIAGNOSIS — N390 Urinary tract infection, site not specified: Secondary | ICD-10-CM | POA: Diagnosis not present

## 2017-12-06 ENCOUNTER — Ambulatory Visit
Admission: RE | Admit: 2017-12-06 | Discharge: 2017-12-06 | Disposition: A | Discharge status: Home / Self Care | Payer: Medicare Other | Source: Ambulatory Visit | Attending: Internal Medicine | Admitting: Internal Medicine

## 2017-12-06 ENCOUNTER — Other Ambulatory Visit: Payer: Medicare Other

## 2017-12-06 ENCOUNTER — Other Ambulatory Visit: Payer: Self-pay | Admitting: Internal Medicine

## 2017-12-06 DIAGNOSIS — R921 Mammographic calcification found on diagnostic imaging of breast: Secondary | ICD-10-CM | POA: Diagnosis not present

## 2018-01-23 DIAGNOSIS — E039 Hypothyroidism, unspecified: Secondary | ICD-10-CM | POA: Diagnosis not present

## 2018-01-23 DIAGNOSIS — E782 Mixed hyperlipidemia: Secondary | ICD-10-CM | POA: Diagnosis not present

## 2018-01-23 DIAGNOSIS — M1A0720 Idiopathic chronic gout, left ankle and foot, without tophus (tophi): Secondary | ICD-10-CM | POA: Diagnosis not present

## 2018-01-23 DIAGNOSIS — R7303 Prediabetes: Secondary | ICD-10-CM | POA: Diagnosis not present

## 2018-01-30 DIAGNOSIS — M1A0720 Idiopathic chronic gout, left ankle and foot, without tophus (tophi): Secondary | ICD-10-CM | POA: Diagnosis not present

## 2018-01-30 DIAGNOSIS — R7303 Prediabetes: Secondary | ICD-10-CM | POA: Diagnosis not present

## 2018-01-30 DIAGNOSIS — E782 Mixed hyperlipidemia: Secondary | ICD-10-CM | POA: Diagnosis not present

## 2018-01-30 DIAGNOSIS — I10 Essential (primary) hypertension: Secondary | ICD-10-CM | POA: Diagnosis not present

## 2018-01-30 DIAGNOSIS — K219 Gastro-esophageal reflux disease without esophagitis: Secondary | ICD-10-CM | POA: Diagnosis not present

## 2018-03-17 DIAGNOSIS — H40013 Open angle with borderline findings, low risk, bilateral: Secondary | ICD-10-CM | POA: Diagnosis not present

## 2018-03-17 DIAGNOSIS — Z961 Presence of intraocular lens: Secondary | ICD-10-CM | POA: Diagnosis not present

## 2018-06-05 ENCOUNTER — Ambulatory Visit
Admission: RE | Admit: 2018-06-05 | Discharge: 2018-06-05 | Disposition: A | Discharge status: Home / Self Care | Payer: Medicare Other | Source: Ambulatory Visit | Attending: Internal Medicine | Admitting: Internal Medicine

## 2018-06-05 ENCOUNTER — Other Ambulatory Visit: Payer: Self-pay | Admitting: Internal Medicine

## 2018-06-05 DIAGNOSIS — R921 Mammographic calcification found on diagnostic imaging of breast: Secondary | ICD-10-CM | POA: Diagnosis not present

## 2018-06-05 DIAGNOSIS — R922 Inconclusive mammogram: Secondary | ICD-10-CM | POA: Diagnosis not present

## 2018-06-18 DIAGNOSIS — L089 Local infection of the skin and subcutaneous tissue, unspecified: Secondary | ICD-10-CM | POA: Diagnosis not present

## 2018-07-18 DIAGNOSIS — Z23 Encounter for immunization: Secondary | ICD-10-CM | POA: Diagnosis not present

## 2018-09-10 DIAGNOSIS — K219 Gastro-esophageal reflux disease without esophagitis: Secondary | ICD-10-CM | POA: Diagnosis not present

## 2018-09-10 DIAGNOSIS — I10 Essential (primary) hypertension: Secondary | ICD-10-CM | POA: Diagnosis not present

## 2018-09-10 DIAGNOSIS — L918 Other hypertrophic disorders of the skin: Secondary | ICD-10-CM | POA: Diagnosis not present

## 2018-09-10 DIAGNOSIS — Z780 Asymptomatic menopausal state: Secondary | ICD-10-CM | POA: Diagnosis not present

## 2018-09-10 DIAGNOSIS — E782 Mixed hyperlipidemia: Secondary | ICD-10-CM | POA: Diagnosis not present

## 2018-09-10 DIAGNOSIS — Z0000 Encounter for general adult medical examination without abnormal findings: Secondary | ICD-10-CM | POA: Diagnosis not present

## 2018-09-10 DIAGNOSIS — R7303 Prediabetes: Secondary | ICD-10-CM | POA: Diagnosis not present

## 2018-09-17 DIAGNOSIS — I10 Essential (primary) hypertension: Secondary | ICD-10-CM | POA: Diagnosis not present

## 2018-09-17 DIAGNOSIS — E782 Mixed hyperlipidemia: Secondary | ICD-10-CM | POA: Diagnosis not present

## 2018-09-17 DIAGNOSIS — R7303 Prediabetes: Secondary | ICD-10-CM | POA: Diagnosis not present

## 2018-09-17 DIAGNOSIS — Z0000 Encounter for general adult medical examination without abnormal findings: Secondary | ICD-10-CM | POA: Diagnosis not present

## 2018-09-23 DIAGNOSIS — H401131 Primary open-angle glaucoma, bilateral, mild stage: Secondary | ICD-10-CM | POA: Diagnosis not present

## 2018-09-23 DIAGNOSIS — Z961 Presence of intraocular lens: Secondary | ICD-10-CM | POA: Diagnosis not present

## 2018-10-01 DIAGNOSIS — E782 Mixed hyperlipidemia: Secondary | ICD-10-CM | POA: Diagnosis not present

## 2018-10-01 DIAGNOSIS — D689 Coagulation defect, unspecified: Secondary | ICD-10-CM | POA: Diagnosis not present

## 2018-10-20 DIAGNOSIS — D689 Coagulation defect, unspecified: Secondary | ICD-10-CM | POA: Diagnosis not present

## 2018-11-26 DIAGNOSIS — R05 Cough: Secondary | ICD-10-CM | POA: Diagnosis not present

## 2018-11-26 DIAGNOSIS — J0110 Acute frontal sinusitis, unspecified: Secondary | ICD-10-CM | POA: Diagnosis not present

## 2019-03-12 DIAGNOSIS — R7303 Prediabetes: Secondary | ICD-10-CM | POA: Diagnosis not present

## 2019-03-12 DIAGNOSIS — E039 Hypothyroidism, unspecified: Secondary | ICD-10-CM | POA: Diagnosis not present

## 2019-03-12 DIAGNOSIS — E782 Mixed hyperlipidemia: Secondary | ICD-10-CM | POA: Diagnosis not present

## 2019-03-12 DIAGNOSIS — I10 Essential (primary) hypertension: Secondary | ICD-10-CM | POA: Diagnosis not present

## 2019-03-18 DIAGNOSIS — S43421A Sprain of right rotator cuff capsule, initial encounter: Secondary | ICD-10-CM | POA: Diagnosis not present

## 2019-03-18 DIAGNOSIS — I10 Essential (primary) hypertension: Secondary | ICD-10-CM | POA: Diagnosis not present

## 2019-03-18 DIAGNOSIS — E782 Mixed hyperlipidemia: Secondary | ICD-10-CM | POA: Diagnosis not present

## 2019-03-18 DIAGNOSIS — H9201 Otalgia, right ear: Secondary | ICD-10-CM | POA: Diagnosis not present

## 2019-03-24 DIAGNOSIS — Z961 Presence of intraocular lens: Secondary | ICD-10-CM | POA: Diagnosis not present

## 2019-03-24 DIAGNOSIS — H401131 Primary open-angle glaucoma, bilateral, mild stage: Secondary | ICD-10-CM | POA: Diagnosis not present

## 2019-03-26 DIAGNOSIS — E782 Mixed hyperlipidemia: Secondary | ICD-10-CM | POA: Diagnosis not present

## 2019-03-26 DIAGNOSIS — Z7189 Other specified counseling: Secondary | ICD-10-CM | POA: Diagnosis not present

## 2019-03-26 DIAGNOSIS — R7303 Prediabetes: Secondary | ICD-10-CM | POA: Diagnosis not present

## 2019-03-26 DIAGNOSIS — S43421A Sprain of right rotator cuff capsule, initial encounter: Secondary | ICD-10-CM | POA: Diagnosis not present

## 2019-03-26 DIAGNOSIS — I10 Essential (primary) hypertension: Secondary | ICD-10-CM | POA: Diagnosis not present

## 2019-04-28 ENCOUNTER — Other Ambulatory Visit: Payer: Self-pay | Admitting: Internal Medicine

## 2019-04-28 DIAGNOSIS — R921 Mammographic calcification found on diagnostic imaging of breast: Secondary | ICD-10-CM

## 2019-05-29 DIAGNOSIS — S80869A Insect bite (nonvenomous), unspecified lower leg, initial encounter: Secondary | ICD-10-CM | POA: Diagnosis not present

## 2019-05-29 DIAGNOSIS — Z7189 Other specified counseling: Secondary | ICD-10-CM | POA: Diagnosis not present

## 2019-05-29 DIAGNOSIS — W57XXXA Bitten or stung by nonvenomous insect and other nonvenomous arthropods, initial encounter: Secondary | ICD-10-CM | POA: Diagnosis not present

## 2019-06-11 ENCOUNTER — Other Ambulatory Visit: Payer: Self-pay

## 2019-06-11 ENCOUNTER — Ambulatory Visit
Admission: RE | Admit: 2019-06-11 | Discharge: 2019-06-11 | Disposition: A | Discharge status: Home / Self Care | Payer: Medicare Other | Source: Ambulatory Visit | Attending: Internal Medicine | Admitting: Internal Medicine

## 2019-06-11 DIAGNOSIS — R921 Mammographic calcification found on diagnostic imaging of breast: Secondary | ICD-10-CM

## 2019-06-11 DIAGNOSIS — R922 Inconclusive mammogram: Secondary | ICD-10-CM | POA: Diagnosis not present

## 2019-07-27 DIAGNOSIS — J302 Other seasonal allergic rhinitis: Secondary | ICD-10-CM | POA: Diagnosis not present

## 2019-07-27 DIAGNOSIS — M792 Neuralgia and neuritis, unspecified: Secondary | ICD-10-CM | POA: Diagnosis not present

## 2019-09-23 DIAGNOSIS — H401131 Primary open-angle glaucoma, bilateral, mild stage: Secondary | ICD-10-CM | POA: Diagnosis not present

## 2019-09-23 DIAGNOSIS — Z0000 Encounter for general adult medical examination without abnormal findings: Secondary | ICD-10-CM | POA: Diagnosis not present

## 2019-09-23 DIAGNOSIS — E782 Mixed hyperlipidemia: Secondary | ICD-10-CM | POA: Diagnosis not present

## 2019-09-23 DIAGNOSIS — Z961 Presence of intraocular lens: Secondary | ICD-10-CM | POA: Diagnosis not present

## 2019-09-23 DIAGNOSIS — Z23 Encounter for immunization: Secondary | ICD-10-CM | POA: Diagnosis not present

## 2019-09-23 DIAGNOSIS — I10 Essential (primary) hypertension: Secondary | ICD-10-CM | POA: Diagnosis not present

## 2019-09-23 DIAGNOSIS — H04123 Dry eye syndrome of bilateral lacrimal glands: Secondary | ICD-10-CM | POA: Diagnosis not present

## 2019-09-23 DIAGNOSIS — R7303 Prediabetes: Secondary | ICD-10-CM | POA: Diagnosis not present

## 2019-09-30 DIAGNOSIS — J301 Allergic rhinitis due to pollen: Secondary | ICD-10-CM | POA: Diagnosis not present

## 2019-09-30 DIAGNOSIS — I10 Essential (primary) hypertension: Secondary | ICD-10-CM | POA: Diagnosis not present

## 2019-09-30 DIAGNOSIS — E782 Mixed hyperlipidemia: Secondary | ICD-10-CM | POA: Diagnosis not present

## 2019-09-30 DIAGNOSIS — R7303 Prediabetes: Secondary | ICD-10-CM | POA: Diagnosis not present

## 2019-09-30 DIAGNOSIS — Z0000 Encounter for general adult medical examination without abnormal findings: Secondary | ICD-10-CM | POA: Diagnosis not present

## 2019-10-19 DIAGNOSIS — J301 Allergic rhinitis due to pollen: Secondary | ICD-10-CM | POA: Diagnosis not present

## 2020-03-22 DIAGNOSIS — H401131 Primary open-angle glaucoma, bilateral, mild stage: Secondary | ICD-10-CM | POA: Diagnosis not present

## 2020-04-14 DIAGNOSIS — M25561 Pain in right knee: Secondary | ICD-10-CM | POA: Diagnosis not present

## 2020-04-19 IMAGING — MG DIGITAL DIAGNOSTIC BILATERAL MAMMOGRAM WITH TOMO AND CAD
6 of 10 series · 6 of 26 positions shown · non-contrast
Comparison: Previous exam(s).

CLINICAL DATA: Patient for follow-up of probably benign left breast
calcifications.

EXAM:
DIGITAL DIAGNOSTIC BILATERAL MAMMOGRAM WITH CAD AND TOMO

[L CC]
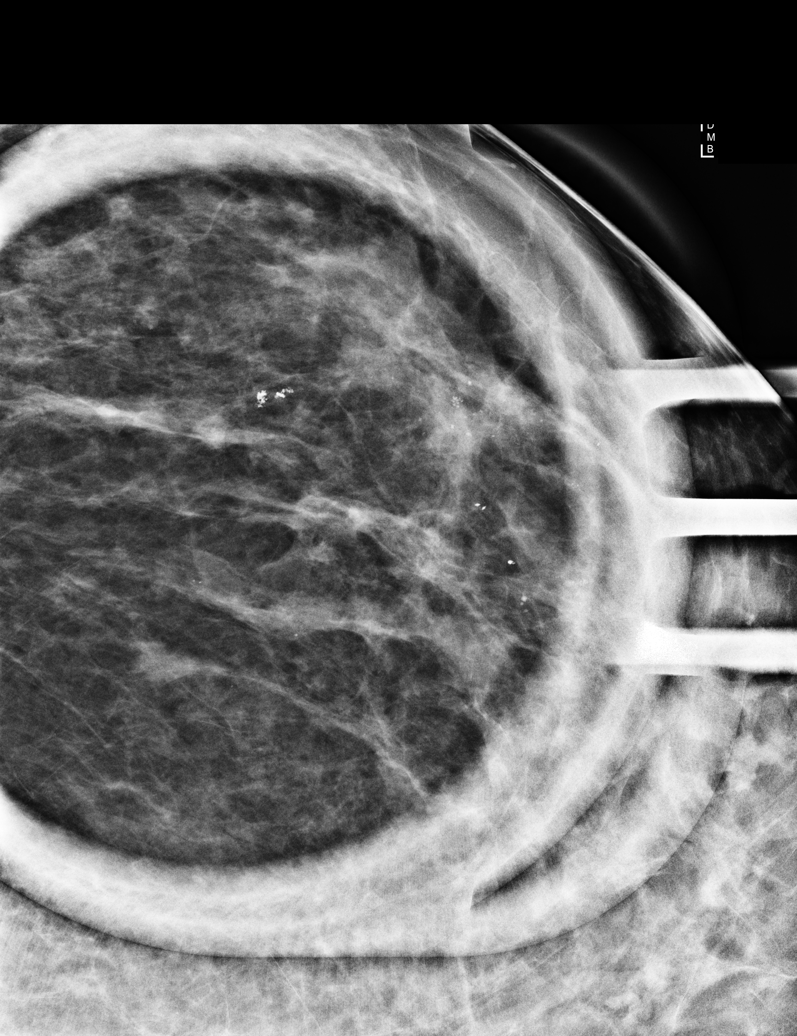

[L ML]
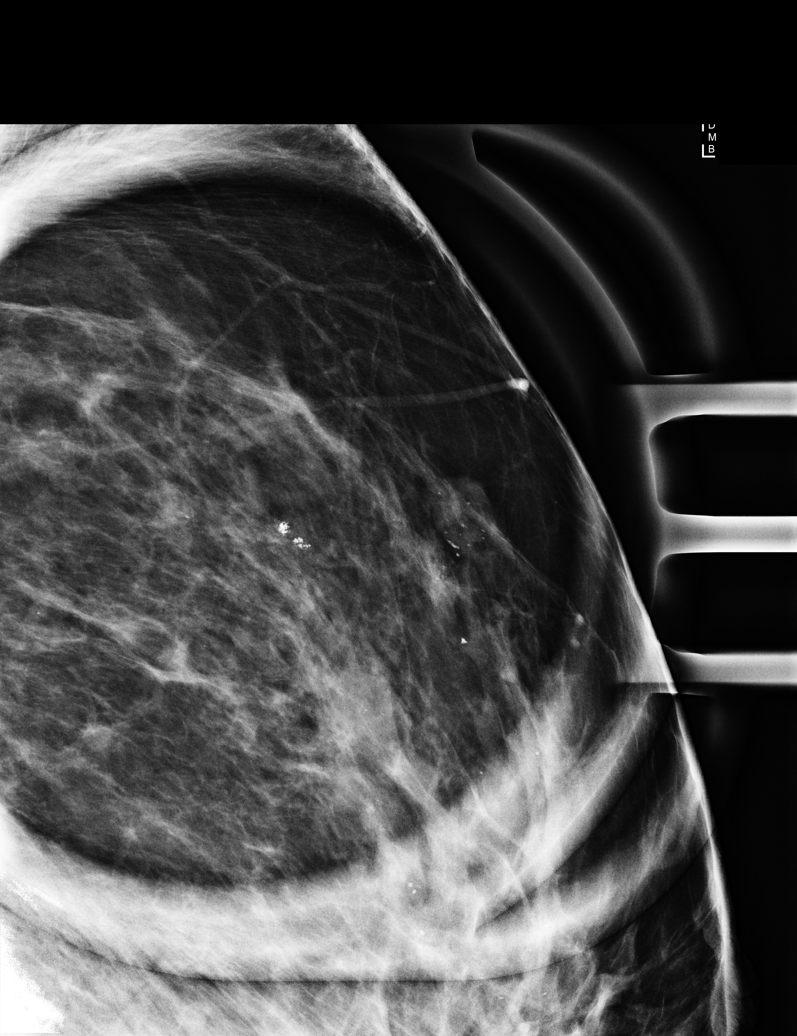

[L MLO synth-2D]
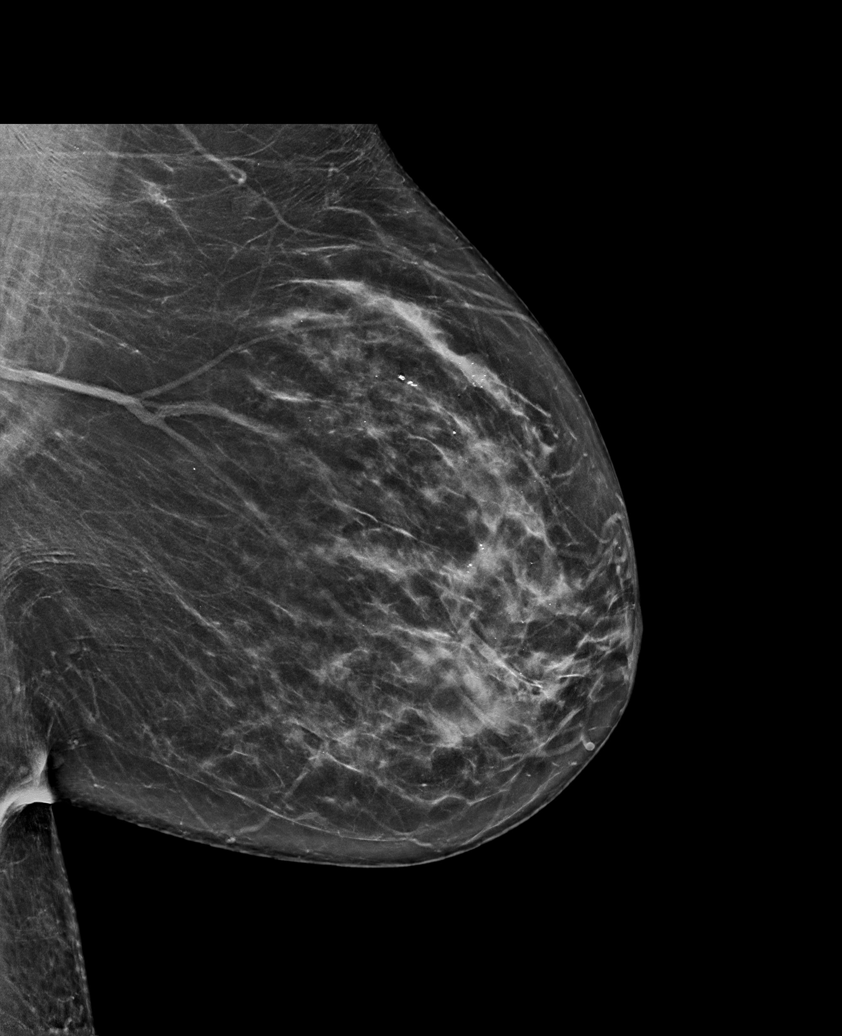

[R MLO synth-2D]
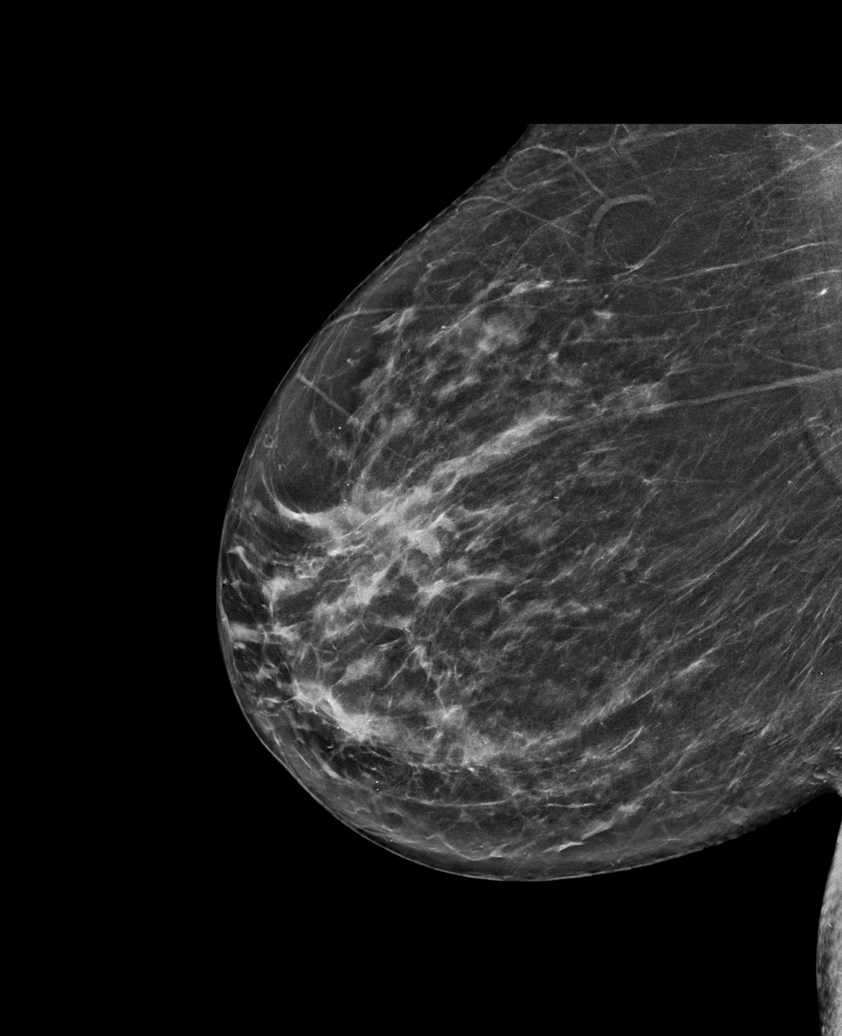

[L CC synth-2D]
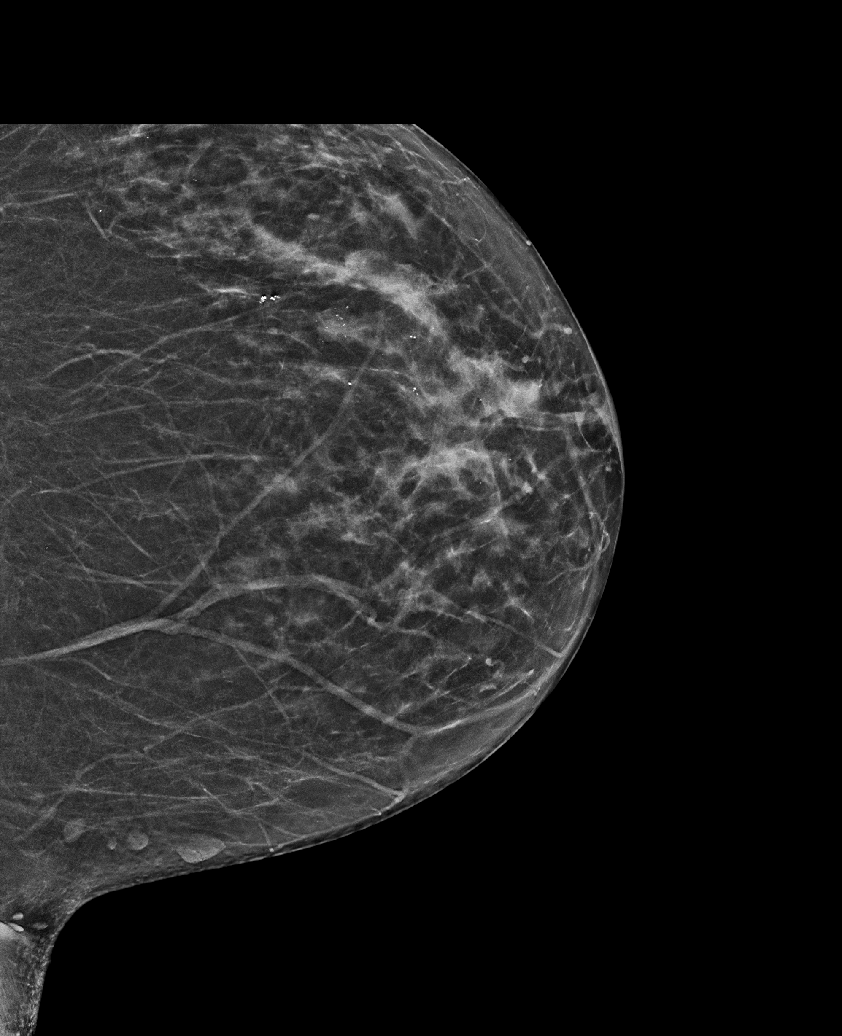

[R CC synth-2D]
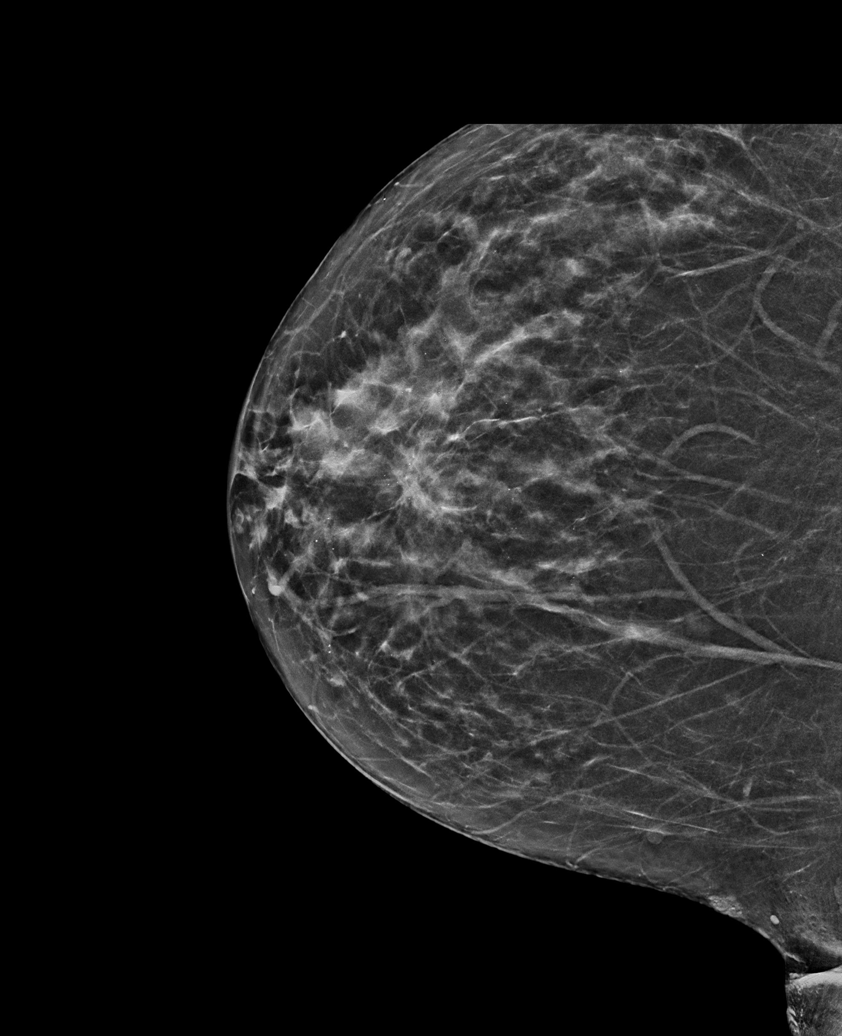

[6 of 26 positions shown; findings below may reference images not displayed]

ACR Breast Density Category c: The breast tissue is heterogeneously
dense, which may obscure small masses.
FINDINGS: Magnification CC and true lateral views of the left breast
demonstrate unchanged dystrophic calcifications within the
upper-outer left breast. No new masses, calcifications or distortion
identified within either breast.

Mammographic images were processed with CAD.
IMPRESSION: No mammographic evidence for malignancy.

Benign left breast dystrophic calcifications.

RECOMMENDATION:
Screening mammogram in one year.(Code:[id number])

I have discussed the findings and recommendations with the patient.
Results were also provided in writing at the conclusion of the
visit. If applicable, a reminder letter will be sent to the patient
regarding the next appointment.

BI-RADS CATEGORY  2: Benign.

## 2020-05-04 DIAGNOSIS — E039 Hypothyroidism, unspecified: Secondary | ICD-10-CM | POA: Diagnosis not present

## 2020-05-04 DIAGNOSIS — R7303 Prediabetes: Secondary | ICD-10-CM | POA: Diagnosis not present

## 2020-05-04 DIAGNOSIS — E782 Mixed hyperlipidemia: Secondary | ICD-10-CM | POA: Diagnosis not present

## 2020-05-04 DIAGNOSIS — I10 Essential (primary) hypertension: Secondary | ICD-10-CM | POA: Diagnosis not present

## 2020-05-05 ENCOUNTER — Other Ambulatory Visit: Payer: Self-pay | Admitting: Internal Medicine

## 2020-05-05 DIAGNOSIS — Z1231 Encounter for screening mammogram for malignant neoplasm of breast: Secondary | ICD-10-CM

## 2020-05-11 DIAGNOSIS — I10 Essential (primary) hypertension: Secondary | ICD-10-CM | POA: Diagnosis not present

## 2020-05-11 DIAGNOSIS — E039 Hypothyroidism, unspecified: Secondary | ICD-10-CM | POA: Diagnosis not present

## 2020-05-11 DIAGNOSIS — E782 Mixed hyperlipidemia: Secondary | ICD-10-CM | POA: Diagnosis not present

## 2020-05-11 DIAGNOSIS — R7303 Prediabetes: Secondary | ICD-10-CM | POA: Diagnosis not present

## 2020-06-14 ENCOUNTER — Ambulatory Visit
Admission: RE | Admit: 2020-06-14 | Discharge: 2020-06-14 | Disposition: A | Discharge status: Home / Self Care | Payer: Medicare Other | Source: Ambulatory Visit | Attending: Internal Medicine | Admitting: Internal Medicine

## 2020-06-14 ENCOUNTER — Other Ambulatory Visit: Payer: Self-pay

## 2020-06-14 DIAGNOSIS — Z1231 Encounter for screening mammogram for malignant neoplasm of breast: Secondary | ICD-10-CM

## 2020-06-15 ENCOUNTER — Other Ambulatory Visit: Payer: Self-pay | Admitting: Internal Medicine

## 2020-06-15 DIAGNOSIS — R928 Other abnormal and inconclusive findings on diagnostic imaging of breast: Secondary | ICD-10-CM

## 2020-06-28 ENCOUNTER — Ambulatory Visit
Admission: RE | Admit: 2020-06-28 | Discharge: 2020-06-28 | Disposition: A | Discharge status: Home / Self Care | Payer: Medicare Other | Source: Ambulatory Visit | Attending: Internal Medicine | Admitting: Internal Medicine

## 2020-06-28 ENCOUNTER — Ambulatory Visit: Payer: Medicare Other

## 2020-06-28 ENCOUNTER — Other Ambulatory Visit: Payer: Self-pay

## 2020-06-28 DIAGNOSIS — R928 Other abnormal and inconclusive findings on diagnostic imaging of breast: Secondary | ICD-10-CM

## 2020-06-28 DIAGNOSIS — R922 Inconclusive mammogram: Secondary | ICD-10-CM | POA: Diagnosis not present

## 2020-09-21 DIAGNOSIS — H04123 Dry eye syndrome of bilateral lacrimal glands: Secondary | ICD-10-CM | POA: Diagnosis not present

## 2020-09-21 DIAGNOSIS — H401131 Primary open-angle glaucoma, bilateral, mild stage: Secondary | ICD-10-CM | POA: Diagnosis not present

## 2020-09-21 DIAGNOSIS — Z961 Presence of intraocular lens: Secondary | ICD-10-CM | POA: Diagnosis not present

## 2020-10-07 DIAGNOSIS — I10 Essential (primary) hypertension: Secondary | ICD-10-CM | POA: Diagnosis not present

## 2020-10-07 DIAGNOSIS — E039 Hypothyroidism, unspecified: Secondary | ICD-10-CM | POA: Diagnosis not present

## 2020-10-07 DIAGNOSIS — Z0000 Encounter for general adult medical examination without abnormal findings: Secondary | ICD-10-CM | POA: Diagnosis not present

## 2020-10-07 DIAGNOSIS — R7303 Prediabetes: Secondary | ICD-10-CM | POA: Diagnosis not present

## 2020-10-07 DIAGNOSIS — E782 Mixed hyperlipidemia: Secondary | ICD-10-CM | POA: Diagnosis not present

## 2020-10-14 DIAGNOSIS — E782 Mixed hyperlipidemia: Secondary | ICD-10-CM | POA: Diagnosis not present

## 2020-10-14 DIAGNOSIS — I10 Essential (primary) hypertension: Secondary | ICD-10-CM | POA: Diagnosis not present

## 2020-10-14 DIAGNOSIS — Z0000 Encounter for general adult medical examination without abnormal findings: Secondary | ICD-10-CM | POA: Diagnosis not present

## 2020-10-14 DIAGNOSIS — N182 Chronic kidney disease, stage 2 (mild): Secondary | ICD-10-CM | POA: Diagnosis not present

## 2020-11-28 ENCOUNTER — Other Ambulatory Visit: Payer: Medicare Other

## 2020-11-28 DIAGNOSIS — Z20822 Contact with and (suspected) exposure to covid-19: Secondary | ICD-10-CM | POA: Diagnosis not present

## 2020-11-29 LAB — NOVEL CORONAVIRUS, NAA: SARS-CoV-2, NAA: NOT DETECTED

## 2020-11-29 LAB — SARS-COV-2, NAA 2 DAY TAT

## 2021-03-09 DIAGNOSIS — H401131 Primary open-angle glaucoma, bilateral, mild stage: Secondary | ICD-10-CM | POA: Diagnosis not present

## 2021-04-18 DIAGNOSIS — R42 Dizziness and giddiness: Secondary | ICD-10-CM | POA: Diagnosis not present

## 2021-04-18 DIAGNOSIS — I951 Orthostatic hypotension: Secondary | ICD-10-CM | POA: Diagnosis not present

## 2021-04-18 DIAGNOSIS — I10 Essential (primary) hypertension: Secondary | ICD-10-CM | POA: Diagnosis not present

## 2021-04-21 DIAGNOSIS — E782 Mixed hyperlipidemia: Secondary | ICD-10-CM | POA: Diagnosis not present

## 2021-04-28 DIAGNOSIS — E782 Mixed hyperlipidemia: Secondary | ICD-10-CM | POA: Diagnosis not present

## 2021-04-28 DIAGNOSIS — R7303 Prediabetes: Secondary | ICD-10-CM | POA: Diagnosis not present

## 2021-04-28 DIAGNOSIS — I10 Essential (primary) hypertension: Secondary | ICD-10-CM | POA: Diagnosis not present

## 2021-05-19 DIAGNOSIS — R600 Localized edema: Secondary | ICD-10-CM | POA: Diagnosis not present

## 2021-05-19 DIAGNOSIS — I10 Essential (primary) hypertension: Secondary | ICD-10-CM | POA: Diagnosis not present

## 2021-05-25 DIAGNOSIS — R600 Localized edema: Secondary | ICD-10-CM | POA: Diagnosis not present

## 2021-05-25 DIAGNOSIS — R300 Dysuria: Secondary | ICD-10-CM | POA: Diagnosis not present

## 2021-05-25 DIAGNOSIS — I10 Essential (primary) hypertension: Secondary | ICD-10-CM | POA: Diagnosis not present

## 2021-05-29 ENCOUNTER — Other Ambulatory Visit: Payer: Self-pay | Admitting: Internal Medicine

## 2021-05-29 DIAGNOSIS — Z1231 Encounter for screening mammogram for malignant neoplasm of breast: Secondary | ICD-10-CM

## 2021-06-19 DIAGNOSIS — I10 Essential (primary) hypertension: Secondary | ICD-10-CM | POA: Diagnosis not present

## 2021-06-19 DIAGNOSIS — J302 Other seasonal allergic rhinitis: Secondary | ICD-10-CM | POA: Diagnosis not present

## 2021-06-19 DIAGNOSIS — N182 Chronic kidney disease, stage 2 (mild): Secondary | ICD-10-CM | POA: Diagnosis not present

## 2021-06-19 DIAGNOSIS — E782 Mixed hyperlipidemia: Secondary | ICD-10-CM | POA: Diagnosis not present

## 2021-07-19 ENCOUNTER — Other Ambulatory Visit: Payer: Self-pay

## 2021-07-19 ENCOUNTER — Ambulatory Visit
Admission: RE | Admit: 2021-07-19 | Discharge: 2021-07-19 | Disposition: A | Discharge status: Home / Self Care | Payer: Medicare Other | Source: Ambulatory Visit | Attending: Internal Medicine | Admitting: Internal Medicine

## 2021-07-19 DIAGNOSIS — Z1231 Encounter for screening mammogram for malignant neoplasm of breast: Secondary | ICD-10-CM | POA: Diagnosis not present

## 2021-08-17 ENCOUNTER — Other Ambulatory Visit: Payer: Self-pay

## 2021-08-17 ENCOUNTER — Ambulatory Visit
Admission: EM | Admit: 2021-08-17 | Discharge: 2021-08-17 | Disposition: A | Discharge status: Home / Self Care | Payer: Medicare Other

## 2021-08-17 DIAGNOSIS — M545 Low back pain, unspecified: Secondary | ICD-10-CM | POA: Diagnosis not present

## 2021-08-17 MED ORDER — MELOXICAM 7.5 MG PO TABS: 7.5000 mg | ORAL_TABLET | Freq: Every day | ORAL | 0 refills | Status: AC

## 2021-08-17 MED ORDER — METHOCARBAMOL 500 MG PO TABS: 500.0000 mg | ORAL_TABLET | Freq: Two times a day (BID) | ORAL | 0 refills | Status: DC | PRN

## 2021-08-17 NOTE — Discharge Instructions (Addendum)
Take the meloxicam as directed.  Take the muscle relaxer as needed for muscle spasm; Do not drive, operate machinery, or drink alcohol with this medication as it can cause drowsiness.   Follow up with an orthopedist if your symptoms are not improving.

## 2021-08-17 NOTE — ED Provider Notes (Signed)
[facility]    CSN: [id number] Arrival date & time: 08/17/21  1100      History   Chief Complaint Chief Complaint  Patient presents with   Muscle Pain    Pulled muscle x 2 days     HPI Angela Greer is a 70 y.o. female.  Patient presents with right lower back pain x2 days.  The pain started after she was lifting heavy boxes at home.  The pain is nonradiating; worse with position changes and ambulation; improves with rest.  No treatments attempted at home.  She denies numbness, weakness, paresthesias, saddle anesthesia, loss of bowel/bladder control, abdominal pain, dysuria, hematuria, or other symptoms.  Her medical history includes degenerative joint disease, lower extremity edema, prediabetes, hypertension, hyperlipidemia, CKD, obesity, GERD, diverticulosis, gout.  The history is provided by the patient and medical records.   Past Medical History:  Diagnosis Date   Acid reflux    Diverticulitis    Gout    Hypertension     Patient Active Problem List   Diagnosis Date Noted   HYPERLIPIDEMIA, MIXED 05/17/2007   OBESITY 05/17/2007   ULNAR NEUROPATHY, RIGHT 05/17/2007   ALLERGIC RHINITIS 05/17/2007   GASTROESOPHAGEAL REFLUX DISEASE 05/17/2007   HIATAL HERNIA 05/17/2007   CYSTITIS NOS 05/17/2007   DEGENERATIVE JOINT DISEASE, HIPS 05/17/2007   DUPUYTREN'S CONTRACTURE, RIGHT 05/17/2007   AVASCULAR NECROSIS, FEMORAL HEAD 05/17/2007   ABNORMAL PAP SMEAR 11/05/1988    Past Surgical History:  Procedure Laterality Date   TOTAL HIP ARTHROPLASTY Bilateral     OB History   No obstetric history on file.      Home Medications    Prior to Admission medications   Medication Sig Start Date End Date Taking? Authorizing Provider  allopurinol (ZYLOPRIM) 100 MG tablet Take 1 tablet by mouth daily. 02/22/20  Yes [provider]  meloxicam (MOBIC) 7.5 MG tablet Take 1 tablet (7.5 mg total) by mouth daily for 7 days. 08/17/21 08/24/21 Yes Mickie Bail, NP   methocarbamol (ROBAXIN) 500 MG tablet Take 1 tablet (500 mg total) by mouth 2 (two) times daily as needed for muscle spasms. 08/17/21  Yes Mickie Bail, NP  AMLODIPINE BESYLATE PO     [provider]  Calcium-Magnesium-Vitamin D 600-40-500 MG-MG-UNIT TB24 2 tablets    [provider]  Chlorpheniramine Maleate (ALLERGY RELIEF PO) 1 tablet    [provider]  docusate sodium (STOOL SOFTENER) 100 MG capsule 1 capsule as needed    [provider]  hydrochlorothiazide (HYDRODIURIL) 25 MG tablet 1 tablet in the morning    [provider]  montelukast (SINGULAIR) 10 MG tablet Take 10 mg by mouth daily. 07/02/21   [provider]  OVER THE COUNTER MEDICATION Unable to remember names of medicines and medicines are not with her.   Blood pressure medicine Gout medicine Acid reflux    [provider]  rosuvastatin (CRESTOR) 10 MG tablet Take 10 mg by mouth at bedtime. 08/16/21   [provider]    Family History Family History  Problem Relation Age of Onset   Breast cancer Mother    Breast cancer Sister     Social History Social History   Tobacco Use   Smoking status: Former   Smokeless tobacco: Never  Building services engineer Use: Never used  Substance Use Topics   Alcohol use: No   Drug use: No     Allergies   Pravastatin, Pravastatin sodium, and Simvastatin  Review of Systems Review of Systems  Constitutional:  Negative for chills and fever.  Respiratory:  Negative for cough and shortness of breath.   Cardiovascular:  Negative for chest pain and palpitations.  Gastrointestinal:  Negative for abdominal pain and vomiting.  Genitourinary:  Negative for dysuria and hematuria.  Musculoskeletal:  Positive for back pain. Negative for gait problem.  Skin:  Negative for color change and rash.  Neurological:  Negative for weakness and numbness.  All other systems reviewed and are negative.   Physical Exam Triage  Vital Signs ED Triage Vitals  Enc Vitals Group     BP 08/17/21 1143 128/85     Pulse Rate 08/17/21 1143 89     Resp 08/17/21 1143 18     Temp 08/17/21 1143 97.9 F (36.6 C)     Temp Source 08/17/21 1143 Oral     SpO2 08/17/21 1143 96 %     Weight --      Height --      Head Circumference --      Peak Flow --      Pain Score 08/17/21 1145 9     Pain Loc --      Pain Edu? --      Excl. in GC? --    No data found.  Updated Vital Signs BP 128/85 (BP Location: Left Arm)   Pulse 89   Temp 97.9 F (36.6 C) (Oral)   Resp 18   SpO2 96%   Visual Acuity Right Eye Distance:   Left Eye Distance:   Bilateral Distance:    Right Eye Near:   Left Eye Near:    Bilateral Near:     Physical Exam Vitals and nursing note reviewed.  Constitutional:      General: She is not in acute distress.    Appearance: Normal appearance. She is well-developed.  HENT:     Head: Normocephalic and atraumatic.     Mouth/Throat:     Mouth: Mucous membranes are moist.  Eyes:     Conjunctiva/sclera: Conjunctivae normal.  Cardiovascular:     Rate and Rhythm: Normal rate and regular rhythm.     Heart sounds: Normal heart sounds.  Pulmonary:     Effort: Pulmonary effort is normal. No respiratory distress.     Breath sounds: Normal breath sounds.  Abdominal:     Palpations: Abdomen is soft.     Tenderness: There is no abdominal tenderness. There is no right CVA tenderness, left CVA tenderness, guarding or rebound.  Musculoskeletal:        General: No swelling, tenderness, deformity or signs of injury. Normal range of motion.     Cervical back: Neck supple.  Skin:    General: Skin is warm and dry.     Capillary Refill: Capillary refill takes less than 2 seconds.     Findings: No bruising, erythema, lesion or rash.  Neurological:     General: No focal deficit present.     Mental Status: She is alert and oriented to person, place, and time.     Sensory: No sensory deficit.     Motor: No weakness.      Gait: Gait abnormal.     Comments: Slow careful gait due to discomfort.  Psychiatric:        Mood and Affect: Mood normal.        Behavior: Behavior normal.     UC Treatments / Results  Labs (all labs ordered are listed, but only abnormal  results are displayed) Labs Reviewed - No data to display  EKG   Radiology No results found.  Procedures Procedures (including critical care time)  Medications Ordered in UC Medications - No data to display  Initial Impression / Assessment and Plan / UC Course  I have reviewed the triage vital signs and the nursing notes.  Pertinent labs & imaging results that were available during my care of the patient were reviewed by me and considered in my medical decision making (see chart for details).  Acute right lower back pain without sciatica.  Treating with meloxicam and methocarbamol.  Lengthy discussion about risks versus benefits of methocarbamol; precautions for drowsiness discussed; instructed patient to change positions slowly.  Instructed her to follow-up with orthopedics if her symptoms or not improving.  She agrees to plan of care.  Patient also requests assistance from [facility] in establishing a new PCP.   Final Clinical Impressions(s) / UC Diagnoses   Final diagnoses:  Acute right-sided low back pain without sciatica     Discharge Instructions      Take the meloxicam as directed.  Take the muscle relaxer as needed for muscle spasm; Do not drive, operate machinery, or drink alcohol with this medication as it can cause drowsiness.   Follow up with an orthopedist if your symptoms are not improving.         ED Prescriptions     Medication Sig Dispense Auth. Provider   meloxicam (MOBIC) 7.5 MG tablet Take 1 tablet (7.5 mg total) by mouth daily for 7 days. 7 tablet Mickie Bail, NP   methocarbamol (ROBAXIN) 500 MG tablet Take 1 tablet (500 mg total) by mouth 2 (two) times daily as needed for muscle spasms. 10 tablet  Mickie Bail, NP      I have reviewed the PDMP during this encounter.   Mickie Bail, NP 08/17/21 1213

## 2021-08-17 NOTE — ED Triage Notes (Signed)
Patient presents to Urgent Care with complaints of lower back muscle pain x 2 days. She states worse today. Pt states she was lifting boxes and the cause of the muscle strain. Not treating symptoms with meds.

## 2021-09-05 DIAGNOSIS — H04123 Dry eye syndrome of bilateral lacrimal glands: Secondary | ICD-10-CM | POA: Diagnosis not present

## 2021-09-05 DIAGNOSIS — Z961 Presence of intraocular lens: Secondary | ICD-10-CM | POA: Diagnosis not present

## 2021-09-05 DIAGNOSIS — H401131 Primary open-angle glaucoma, bilateral, mild stage: Secondary | ICD-10-CM | POA: Diagnosis not present

## 2021-10-19 DIAGNOSIS — R300 Dysuria: Secondary | ICD-10-CM | POA: Diagnosis not present

## 2021-11-10 DIAGNOSIS — R7303 Prediabetes: Secondary | ICD-10-CM | POA: Diagnosis not present

## 2021-11-10 DIAGNOSIS — N182 Chronic kidney disease, stage 2 (mild): Secondary | ICD-10-CM | POA: Diagnosis not present

## 2021-11-10 DIAGNOSIS — E782 Mixed hyperlipidemia: Secondary | ICD-10-CM | POA: Diagnosis not present

## 2021-11-10 DIAGNOSIS — Z0000 Encounter for general adult medical examination without abnormal findings: Secondary | ICD-10-CM | POA: Diagnosis not present

## 2021-11-10 DIAGNOSIS — I10 Essential (primary) hypertension: Secondary | ICD-10-CM | POA: Diagnosis not present

## 2021-11-10 DIAGNOSIS — Z23 Encounter for immunization: Secondary | ICD-10-CM | POA: Diagnosis not present

## 2021-11-17 DIAGNOSIS — E782 Mixed hyperlipidemia: Secondary | ICD-10-CM | POA: Diagnosis not present

## 2021-11-17 DIAGNOSIS — I10 Essential (primary) hypertension: Secondary | ICD-10-CM | POA: Diagnosis not present

## 2021-11-17 DIAGNOSIS — R7303 Prediabetes: Secondary | ICD-10-CM | POA: Diagnosis not present

## 2021-11-17 DIAGNOSIS — N182 Chronic kidney disease, stage 2 (mild): Secondary | ICD-10-CM | POA: Diagnosis not present

## 2021-11-17 DIAGNOSIS — Z0000 Encounter for general adult medical examination without abnormal findings: Secondary | ICD-10-CM | POA: Diagnosis not present

## 2021-11-17 DIAGNOSIS — E039 Hypothyroidism, unspecified: Secondary | ICD-10-CM | POA: Diagnosis not present

## 2022-03-06 DIAGNOSIS — H1045 Other chronic allergic conjunctivitis: Secondary | ICD-10-CM | POA: Diagnosis not present

## 2022-03-06 DIAGNOSIS — H401131 Primary open-angle glaucoma, bilateral, mild stage: Secondary | ICD-10-CM | POA: Diagnosis not present

## 2022-05-11 DIAGNOSIS — R7303 Prediabetes: Secondary | ICD-10-CM | POA: Diagnosis not present

## 2022-05-11 DIAGNOSIS — E782 Mixed hyperlipidemia: Secondary | ICD-10-CM | POA: Diagnosis not present

## 2022-05-11 DIAGNOSIS — E039 Hypothyroidism, unspecified: Secondary | ICD-10-CM | POA: Diagnosis not present

## 2022-05-11 DIAGNOSIS — N182 Chronic kidney disease, stage 2 (mild): Secondary | ICD-10-CM | POA: Diagnosis not present

## 2022-05-11 DIAGNOSIS — I10 Essential (primary) hypertension: Secondary | ICD-10-CM | POA: Diagnosis not present

## 2022-05-18 DIAGNOSIS — I10 Essential (primary) hypertension: Secondary | ICD-10-CM | POA: Diagnosis not present

## 2022-05-18 DIAGNOSIS — R7303 Prediabetes: Secondary | ICD-10-CM | POA: Diagnosis not present

## 2022-05-18 DIAGNOSIS — N182 Chronic kidney disease, stage 2 (mild): Secondary | ICD-10-CM | POA: Diagnosis not present

## 2022-05-18 DIAGNOSIS — E039 Hypothyroidism, unspecified: Secondary | ICD-10-CM | POA: Diagnosis not present

## 2022-05-18 DIAGNOSIS — E782 Mixed hyperlipidemia: Secondary | ICD-10-CM | POA: Diagnosis not present

## 2022-06-28 DIAGNOSIS — Z20822 Contact with and (suspected) exposure to covid-19: Secondary | ICD-10-CM | POA: Diagnosis not present

## 2022-08-02 DIAGNOSIS — N3001 Acute cystitis with hematuria: Secondary | ICD-10-CM | POA: Diagnosis not present

## 2022-08-02 DIAGNOSIS — R300 Dysuria: Secondary | ICD-10-CM | POA: Diagnosis not present

## 2022-10-10 ENCOUNTER — Other Ambulatory Visit: Payer: Self-pay | Admitting: Internal Medicine

## 2022-10-10 DIAGNOSIS — Z1231 Encounter for screening mammogram for malignant neoplasm of breast: Secondary | ICD-10-CM

## 2022-12-05 ENCOUNTER — Ambulatory Visit
Admission: RE | Admit: 2022-12-05 | Discharge: 2022-12-05 | Disposition: A | Discharge status: Home / Self Care | Payer: 59 | Source: Ambulatory Visit | Attending: Internal Medicine | Admitting: Internal Medicine

## 2022-12-05 DIAGNOSIS — Z1231 Encounter for screening mammogram for malignant neoplasm of breast: Secondary | ICD-10-CM

## 2023-09-10 ENCOUNTER — Other Ambulatory Visit: Payer: Self-pay | Admitting: Family Medicine

## 2023-09-10 DIAGNOSIS — M810 Age-related osteoporosis without current pathological fracture: Secondary | ICD-10-CM

## 2023-11-14 ENCOUNTER — Other Ambulatory Visit: Payer: Self-pay | Admitting: Family Medicine

## 2023-11-14 DIAGNOSIS — Z1231 Encounter for screening mammogram for malignant neoplasm of breast: Secondary | ICD-10-CM

## 2023-11-27 ENCOUNTER — Other Ambulatory Visit: Payer: Self-pay | Admitting: Family Medicine

## 2023-11-27 DIAGNOSIS — Z1231 Encounter for screening mammogram for malignant neoplasm of breast: Secondary | ICD-10-CM

## 2024-01-28 ENCOUNTER — Ambulatory Visit
Admission: RE | Admit: 2024-01-28 | Discharge: 2024-01-28 | Disposition: A | Discharge status: Home / Self Care | Payer: 59 | Source: Ambulatory Visit | Attending: Family Medicine | Admitting: Family Medicine

## 2024-01-28 DIAGNOSIS — Z1231 Encounter for screening mammogram for malignant neoplasm of breast: Secondary | ICD-10-CM | POA: Insufficient documentation

## 2024-01-28 DIAGNOSIS — M810 Age-related osteoporosis without current pathological fracture: Secondary | ICD-10-CM | POA: Insufficient documentation

## 2024-02-11 ENCOUNTER — Other Ambulatory Visit: Payer: Self-pay | Admitting: Family Medicine

## 2024-02-11 DIAGNOSIS — R928 Other abnormal and inconclusive findings on diagnostic imaging of breast: Secondary | ICD-10-CM

## 2024-02-12 ENCOUNTER — Ambulatory Visit
Admission: RE | Admit: 2024-02-12 | Discharge: 2024-02-12 | Discharge status: Home / Self Care | Source: Ambulatory Visit | Attending: Family Medicine | Admitting: Family Medicine

## 2024-02-12 ENCOUNTER — Ambulatory Visit
Admission: RE | Admit: 2024-02-12 | Discharge: 2024-02-12 | Disposition: A | Discharge status: Home / Self Care | Source: Ambulatory Visit | Attending: Family Medicine | Admitting: Family Medicine

## 2024-02-12 DIAGNOSIS — R928 Other abnormal and inconclusive findings on diagnostic imaging of breast: Secondary | ICD-10-CM

## 2024-02-17 ENCOUNTER — Encounter: Payer: Self-pay | Admitting: Orthopedic Surgery

## 2024-02-21 ENCOUNTER — Other Ambulatory Visit: Payer: Self-pay | Admitting: Family Medicine

## 2024-02-21 DIAGNOSIS — R921 Mammographic calcification found on diagnostic imaging of breast: Secondary | ICD-10-CM

## 2024-02-21 DIAGNOSIS — R928 Other abnormal and inconclusive findings on diagnostic imaging of breast: Secondary | ICD-10-CM

## 2024-02-24 ENCOUNTER — Ambulatory Visit
Admission: RE | Admit: 2024-02-24 | Discharge: 2024-02-24 | Discharge status: Home / Self Care | Source: Ambulatory Visit | Attending: Family Medicine | Admitting: Family Medicine

## 2024-02-24 ENCOUNTER — Ambulatory Visit
Admission: RE | Admit: 2024-02-24 | Discharge: 2024-02-24 | Disposition: A | Discharge status: Home / Self Care | Source: Ambulatory Visit | Attending: Family Medicine | Admitting: Family Medicine

## 2024-02-24 DIAGNOSIS — R921 Mammographic calcification found on diagnostic imaging of breast: Secondary | ICD-10-CM | POA: Insufficient documentation

## 2024-02-24 DIAGNOSIS — R928 Other abnormal and inconclusive findings on diagnostic imaging of breast: Secondary | ICD-10-CM | POA: Diagnosis present

## 2024-02-24 DIAGNOSIS — C50111 Malignant neoplasm of central portion of right female breast: Secondary | ICD-10-CM | POA: Diagnosis present

## 2024-02-24 DIAGNOSIS — D0511 Intraductal carcinoma in situ of right breast: Secondary | ICD-10-CM | POA: Insufficient documentation

## 2024-02-24 HISTORY — PX: BREAST BIOPSY: SHX20

## 2024-02-24 MED ORDER — LIDOCAINE-EPINEPHRINE 1 %-1:100000 IJ SOLN
10.0000 mL | Freq: Once | INTRAMUSCULAR | Status: AC
  Administered 2024-02-24: 10 mL
  Filled 2024-02-24: qty 10

## 2024-02-24 MED ORDER — LIDOCAINE 1 % OPTIME INJ - NO CHARGE
5.0000 mL | Freq: Once | INTRAMUSCULAR | Status: AC
  Administered 2024-02-24: 5 mL
  Filled 2024-02-24: qty 6

## 2024-02-24 MED ORDER — LIDOCAINE HCL 1 % IJ SOLN
5.0000 mL | Freq: Once | INTRAMUSCULAR | Status: AC
  Administered 2024-02-24: 5 mL
  Filled 2024-02-24: qty 5

## 2024-02-25 ENCOUNTER — Encounter: Payer: Self-pay | Admitting: *Deleted

## 2024-02-25 DIAGNOSIS — C50911 Malignant neoplasm of unspecified site of right female breast: Secondary | ICD-10-CM

## 2024-02-25 LAB — SURGICAL PATHOLOGY

## 2024-02-25 NOTE — Progress Notes (Signed)
 Received referral for newly diagnosed breast cancer from [facility].  Navigation initiated.  Referral placed to [facility].  She will see Dr. Adrian Alba on Thursday 4/24 at 3:00

## 2024-02-26 ENCOUNTER — Other Ambulatory Visit: Payer: Self-pay

## 2024-02-27 ENCOUNTER — Encounter: Payer: Self-pay | Admitting: Oncology

## 2024-02-27 ENCOUNTER — Inpatient Hospital Stay: Attending: Oncology | Admitting: Oncology

## 2024-02-27 ENCOUNTER — Inpatient Hospital Stay

## 2024-02-27 ENCOUNTER — Encounter: Payer: Self-pay | Admitting: *Deleted

## 2024-02-27 VITALS — BP 109/55 | HR 59 | Temp 98.7°F | Resp 20 | Wt 181.7 lb

## 2024-02-27 DIAGNOSIS — C50911 Malignant neoplasm of unspecified site of right female breast: Secondary | ICD-10-CM | POA: Insufficient documentation

## 2024-02-27 DIAGNOSIS — Z1732 Human epidermal growth factor receptor 2 negative status: Secondary | ICD-10-CM | POA: Insufficient documentation

## 2024-02-27 DIAGNOSIS — Z1721 Progesterone receptor positive status: Secondary | ICD-10-CM | POA: Diagnosis not present

## 2024-02-27 DIAGNOSIS — Z87891 Personal history of nicotine dependence: Secondary | ICD-10-CM | POA: Insufficient documentation

## 2024-02-27 DIAGNOSIS — Z170 Estrogen receptor positive status [ER+]: Secondary | ICD-10-CM | POA: Diagnosis not present

## 2024-02-27 DIAGNOSIS — Z803 Family history of malignant neoplasm of breast: Secondary | ICD-10-CM | POA: Insufficient documentation

## 2024-02-27 DIAGNOSIS — Z9189 Other specified personal risk factors, not elsewhere classified: Secondary | ICD-10-CM

## 2024-02-27 NOTE — Progress Notes (Signed)
 Accompanied patient and family to initial medical oncology appointment.   Reviewed Breast Cancer treatment handbook.   Care plan summary given to patient.   Reviewed outreach programs and cancer center services.

## 2024-02-27 NOTE — Progress Notes (Unsigned)
 [facility]  [phone] Fax:(336) [phone]  ID: Angela Greer OB: 12/08/1950  MR#: [id number]  NUU#:[id number]  Patient Care Team: Virgle Grime, MD as PCP - General (Internal Medicine) Shellie Dials, MD as Consulting Physician (Oncology)  CHIEF COMPLAINT: Clinical stage IIa ER/PR positive, HER2 negative invasive carcinoma of the right breast.  INTERVAL HISTORY: Patient is a 73 year old female who underwent routine screening mammogram followed by ultrasound biopsy revealing the above-stated malignancy.  She currently feels well and is asymptomatic.  She has no neurologic complaints.  She denies any recent fevers or illnesses.  She has a good appetite and denies weight loss.  She has no chest pain, shortness of breath, cough, or hemoptysis.  She denies any nausea, vomiting, constipation, or diarrhea.  She has no urinary complaints.  Patient offers no specific complaints today.   REVIEW OF SYSTEMS:   Review of Systems  Constitutional: Negative.  Negative for fever and malaise/fatigue.  Respiratory: Negative.  Negative for cough, hemoptysis and shortness of breath.   Cardiovascular: Negative.  Negative for chest pain and leg swelling.  Gastrointestinal: Negative.  Negative for abdominal pain.  Genitourinary: Negative.   Musculoskeletal: Negative.  Negative for back pain.  Skin: Negative.  Negative for rash.  Neurological: Negative.  Negative for dizziness, focal weakness and headaches.  Psychiatric/Behavioral: Negative.  The patient is not nervous/anxious.     As per HPI. Otherwise, a complete review of systems is negative.  PAST MEDICAL HISTORY: Past Medical History:  Diagnosis Date   Acid reflux    Diverticulitis    Gout    Hypertension     PAST SURGICAL HISTORY: Past Surgical History:  Procedure Laterality Date   BREAST BIOPSY Right 02/24/2024   Stereo bx, x-clip,path pending   BREAST BIOPSY Right 02/24/2024   Stereo bx,  Coil Clip, path pending   BREAST BIOPSY Right 02/24/2024   MM RT BREAST BX W LOC DEV 1ST LESION IMAGE BX SPEC STEREO GUIDE 02/24/2024 [facility]   BREAST BIOPSY Right 02/24/2024   MM RT BREAST BX W LOC DEV EA AD LESION IMG BX SPEC STEREO GUIDE 02/24/2024 [facility]   TOTAL HIP ARTHROPLASTY Bilateral     FAMILY HISTORY: Family History  Problem Relation Age of Onset   Breast cancer Mother    Breast cancer Sister    Breast cancer Sister    Breast cancer Paternal Aunt     ADVANCED DIRECTIVES (Y/N):  N  HEALTH MAINTENANCE: Social History   Tobacco Use   Smoking status: Former   Smokeless tobacco: Never  Vaping Use   Vaping status: Never Used  Substance Use Topics   Alcohol  use: No   Drug use: No     Colonoscopy:  PAP:  Bone density:  Lipid panel:  Allergies  Allergen Reactions   Pravastatin     REACTION: constipation   Pravastatin Sodium     REACTION: constipation   Simvastatin     REACTION: along with other statins causes myalgias.  CPK remains normal.    Current Outpatient Medications  Medication Sig Dispense Refill   acetaminophen  (TYLENOL ) 500 MG tablet Take by mouth.     allopurinol  (ZYLOPRIM ) 100 MG tablet Take 1 tablet by mouth daily.     Calcium -Magnesium-Vitamin D 600-40-500 MG-MG-UNIT TB24 2 tablets     Chlorpheniramine Maleate (ALLERGY RELIEF PO) 1 tablet     docusate sodium  (STOOL SOFTENER) 100 MG capsule 1 capsule as needed     hydrochlorothiazide (HYDRODIURIL) 25 MG tablet 1 tablet  in the morning     latanoprost  (XALATAN ) 0.005 % ophthalmic solution 1 drop at bedtime.     losartan (COZAAR) 25 MG tablet TAKE 1 TABLET BY MOUTH EVERY DAY FOR 30 DAYS     methocarbamol  (ROBAXIN ) 500 MG tablet Take 1 tablet (500 mg total) by mouth 2 (two) times daily as needed for muscle spasms. 10 tablet 0   montelukast  (SINGULAIR ) 10 MG tablet Take 10 mg by mouth daily.     Omega-3 Fatty Acids (FISH OIL) 1000 MG CAPS 1 capsule.     pantoprazole  (PROTONIX ) 40 MG  tablet 1 tablet Orally Once a day for 100 days for acid reflux     rosuvastatin  (CRESTOR ) 10 MG tablet Take 10 mg by mouth at bedtime.     AMLODIPINE BESYLATE PO  (Patient not taking: Reported on 02/27/2024)     OVER THE COUNTER MEDICATION Unable to remember names of medicines and medicines are not with her.   Blood pressure medicine Gout medicine Acid reflux (Patient not taking: Reported on 02/27/2024)     No current facility-administered medications for this visit.    OBJECTIVE: Vitals:   02/27/24 1519  BP: (!) 109/55  Pulse: (!) 59  Resp: 20  Temp: 98.7 F (37.1 C)  SpO2: 100%     Body mass index is 31.19 kg/m.    ECOG FS:0 - Asymptomatic  General: Well-developed, well-nourished, no acute distress. Eyes: Pink conjunctiva, anicteric sclera. HEENT: Normocephalic, moist mucous membranes. Lungs: No audible wheezing or coughing. Heart: Regular rate and rhythm. Abdomen: Soft, nontender, no obvious distention. Musculoskeletal: No edema, cyanosis, or clubbing. Neuro: Alert, answering all questions appropriately. Cranial nerves grossly intact. Skin: No rashes or petechiae noted. Psych: Normal affect. Lymphatics: No cervical, calvicular, axillary or inguinal LAD.   LAB RESULTS:  Lab Results  Component Value Date   NA 137 04/01/2017   K 2.9 (L) 04/01/2017   CL 101 04/01/2017   CO2 26 04/01/2017   GLUCOSE 113 (H) 04/01/2017   BUN 15 04/01/2017   CREATININE 1.14 (H) 04/01/2017   CALCIUM  9.8 04/01/2017   PROT 7.3 04/01/2017   ALBUMIN 4.0 04/01/2017   AST 27 04/01/2017   ALT 31 04/01/2017   ALKPHOS 100 04/01/2017   BILITOT 0.5 04/01/2017   GFRNONAA 49 (L) 04/01/2017   GFRAA 57 (L) 04/01/2017    Lab Results  Component Value Date   WBC 11.6 (H) 04/01/2017   HGB 15.6 (H) 04/01/2017   HCT 45.5 04/01/2017   MCV 89.6 04/01/2017   PLT 200 04/01/2017     STUDIES: MM RT BREAST BX W LOC DEV 1ST LESION IMAGE BX SPEC STEREO GUIDE Addendum Date: 02/25/2024 ADDENDUM REPORT:  02/25/2024 15:05 ADDENDUM: PATHOLOGY revealed: Site 1. Breast, right, needle core biopsy, upper central middle depth, x clip : INVASIVE MODERATELY DIFFERENTIATED DUCTAL ADENOCARCINOMA, GRADE 2. - EXTENSIVE HIGH-GRADE DUCTAL CARCINOMA IN SITU, SOLID AND CRIBRIFORM TYPES WITH NECROSIS. OVERALL GRADE: GRADE 2. NEGATIVE FOR ANGIOLYMPHATIC INVASION - BACKGROUND FIBROCYSTIC CHANGES INCLUDING STROMAL FIBROSIS AND CYSTIC DILATATION OF DUCTS - MICROCALCIFICATIONS PRESENT WITHIN CYSTIC DUCTS, BENIGN LOBULES, FIBROTIC STROMA AND INVASIVE TUMOR INVASIVE TUMOR MEASURES 2 MM IN GREATEST LINEAR EXTENT. Pathology results are CONCORDANT with imaging findings, per Dr. Clancy Crimes. PATHOLOGY revealed: Site 2. Breast, right, needle core biopsy, upper outer posterior depth, coil clip : DUCTAL CARCINOMA IN SITU, INTERMEDIATE TO HIGH NUCLEAR GRADE, SOLID AND CRIBRIFORM TYPES WITH FOCAL NECROSIS. NEGATIVE FOR INVASIVE CARCINOMA - RARE MICROCALCIFICATIONS PRESENT WITHIN DCIS - DCIS MEASURES 1.6 MM IN  GREATEST LINEAR EXTENT - BACKGROUND STROMAL FIBROSIS AND FOCAL FIBROMATOID CHANGE. Pathology results are CONCORDANT with imaging findings, per Dr. Clancy Crimes. Pathology results and recommendations below were discussed with patient by telephone on 02/25/2024 by Ladonna Pickup RN. Patient reported biopsy site within normal limits with slight tenderness at the site. Post biopsy care instructions were reviewed, questions were answered and my direct phone number was provided to patient. Patient was instructed to call [facility] if any concerns or questions arise related to the biopsy. RECOMMENDATIONS: 1. Surgical and oncological consultation. Request for surgical and oncological consultation relayed to Gwenn Lenz RN at [facility] by Ladonna Pickup RN on 02/25/2024. 2. Recommend pretreatment bilateral breast MRI with and without contrast to assess extent of breast disease. Pathology results reported by Ladonna Pickup RN  on 02/25/2024. Electronically Signed   By: Clancy Crimes M.D.   On: 02/25/2024 15:05   Result Date: 02/25/2024 CLINICAL DATA:  Indeterminate distortion with calcifications as well as indeterminate regional calcifications EXAM: RIGHT BREAST STEREOTACTIC CORE NEEDLE BIOPSY x2 COMPARISON:  Previous exam(s). FINDINGS: The patient and I discussed the procedure of stereotactic-guided biopsy including benefits and alternatives. We discussed the high likelihood of a successful procedure. We discussed the risks of the procedure including infection, bleeding, tissue injury, clip migration, and inadequate sampling. Informed written consent was given. The usual time out protocol was performed immediately prior to the procedure. Site 1: RIGHT upper central breast middle depth architectural distortion with scattered calcifications Using sterile technique and 1% lidocaine  and 1% lidocaine  with epinephrine  as local anesthetic, under stereotactic guidance, a 9 gauge vacuum assisted device was used to perform core needle biopsy of architectural distortion with scattered calcifications in the RIGHT breast using a lateral approach. Specimen radiograph was performed showing representative calcifications. Specimens with calcifications are identified for pathology. Lesion quadrant: Upper outer quadrant At the conclusion of the procedure, an X shaped tissue marker clip was deployed into the biopsy cavity. Follow-up 2-view mammogram was performed and dictated separately. Site 2: RIGHT upper outer breast middle to posterior depth calcifications Using sterile technique and 1% lidocaine  and 1% lidocaine  with epinephrine  as local anesthetic, under stereotactic guidance, a 9 gauge vacuum assisted device was used to perform core needle biopsy of calcifications in the RIGHT upper outer breast using a lateral approach. Specimen radiograph was performed showing representative calcifications. Specimens with calcifications are identified for  pathology. Lesion quadrant: Upper outer quadrant At the conclusion of the procedure, a COIL shaped tissue marker clip was deployed into the biopsy cavity. Follow-up 2-view mammogram was performed and dictated separately. IMPRESSION: Stereotactic-guided biopsy of RIGHT breast subtle distortion with scattered calcifications as well as a separate area of calcifications. No apparent complications. Electronically Signed: By: Clancy Crimes M.D. On: 02/24/2024 09:11   MM RT BREAST BX W LOC DEV EA AD LESION IMG BX SPEC STEREO GUIDE Addendum Date: 02/25/2024 ADDENDUM REPORT: 02/25/2024 15:05 ADDENDUM: PATHOLOGY revealed: Site 1. Breast, right, needle core biopsy, upper central middle depth, x clip : INVASIVE MODERATELY DIFFERENTIATED DUCTAL ADENOCARCINOMA, GRADE 2. - EXTENSIVE HIGH-GRADE DUCTAL CARCINOMA IN SITU, SOLID AND CRIBRIFORM TYPES WITH NECROSIS. OVERALL GRADE: GRADE 2. NEGATIVE FOR ANGIOLYMPHATIC INVASION - BACKGROUND FIBROCYSTIC CHANGES INCLUDING STROMAL FIBROSIS AND CYSTIC DILATATION OF DUCTS - MICROCALCIFICATIONS PRESENT WITHIN CYSTIC DUCTS, BENIGN LOBULES, FIBROTIC STROMA AND INVASIVE TUMOR INVASIVE TUMOR MEASURES 2 MM IN GREATEST LINEAR EXTENT. Pathology results are CONCORDANT with imaging findings, per Dr. Clancy Crimes. PATHOLOGY revealed: Site 2. Breast, right, needle core biopsy,  upper outer posterior depth, coil clip : DUCTAL CARCINOMA IN SITU, INTERMEDIATE TO HIGH NUCLEAR GRADE, SOLID AND CRIBRIFORM TYPES WITH FOCAL NECROSIS. NEGATIVE FOR INVASIVE CARCINOMA - RARE MICROCALCIFICATIONS PRESENT WITHIN DCIS - DCIS MEASURES 1.6 MM IN GREATEST LINEAR EXTENT - BACKGROUND STROMAL FIBROSIS AND FOCAL FIBROMATOID CHANGE. Pathology results are CONCORDANT with imaging findings, per Dr. Clancy Crimes. Pathology results and recommendations below were discussed with patient by telephone on 02/25/2024 by Ladonna Pickup RN. Patient reported biopsy site within normal limits with slight tenderness at the site.  Post biopsy care instructions were reviewed, questions were answered and my direct phone number was provided to patient. Patient was instructed to call [facility] if any concerns or questions arise related to the biopsy. RECOMMENDATIONS: 1. Surgical and oncological consultation. Request for surgical and oncological consultation relayed to Gwenn Lenz RN at [facility] by Ladonna Pickup RN on 02/25/2024. 2. Recommend pretreatment bilateral breast MRI with and without contrast to assess extent of breast disease. Pathology results reported by Ladonna Pickup RN on 02/25/2024. Electronically Signed   By: Clancy Crimes M.D.   On: 02/25/2024 15:05   Result Date: 02/25/2024 CLINICAL DATA:  Indeterminate distortion with calcifications as well as indeterminate regional calcifications EXAM: RIGHT BREAST STEREOTACTIC CORE NEEDLE BIOPSY x2 COMPARISON:  Previous exam(s). FINDINGS: The patient and I discussed the procedure of stereotactic-guided biopsy including benefits and alternatives. We discussed the high likelihood of a successful procedure. We discussed the risks of the procedure including infection, bleeding, tissue injury, clip migration, and inadequate sampling. Informed written consent was given. The usual time out protocol was performed immediately prior to the procedure. Site 1: RIGHT upper central breast middle depth architectural distortion with scattered calcifications Using sterile technique and 1% lidocaine  and 1% lidocaine  with epinephrine  as local anesthetic, under stereotactic guidance, a 9 gauge vacuum assisted device was used to perform core needle biopsy of architectural distortion with scattered calcifications in the RIGHT breast using a lateral approach. Specimen radiograph was performed showing representative calcifications. Specimens with calcifications are identified for pathology. Lesion quadrant: Upper outer quadrant At the conclusion of the procedure, an X shaped tissue  marker clip was deployed into the biopsy cavity. Follow-up 2-view mammogram was performed and dictated separately. Site 2: RIGHT upper outer breast middle to posterior depth calcifications Using sterile technique and 1% lidocaine  and 1% lidocaine  with epinephrine  as local anesthetic, under stereotactic guidance, a 9 gauge vacuum assisted device was used to perform core needle biopsy of calcifications in the RIGHT upper outer breast using a lateral approach. Specimen radiograph was performed showing representative calcifications. Specimens with calcifications are identified for pathology. Lesion quadrant: Upper outer quadrant At the conclusion of the procedure, a COIL shaped tissue marker clip was deployed into the biopsy cavity. Follow-up 2-view mammogram was performed and dictated separately. IMPRESSION: Stereotactic-guided biopsy of RIGHT breast subtle distortion with scattered calcifications as well as a separate area of calcifications. No apparent complications. Electronically Signed: By: Clancy Crimes M.D. On: 02/24/2024 09:11   MM CLIP PLACEMENT RIGHT Result Date: 02/24/2024 CLINICAL DATA:  Status post 2 site stereotactic guided biopsy EXAM: 3D DIAGNOSTIC RIGHT MAMMOGRAM POST STEREOTACTIC BIOPSY COMPARISON:  Previous exam(s). ACR Breast Density Category c: The breasts are heterogeneously dense, which may obscure small masses. FINDINGS: 3D Mammographic images were obtained following stereotactic guided biopsy of a subtle area of distortion with associated calcifications. The X shaped biopsy marking clip is in expected position at the site of biopsy. 3D Mammographic images were  obtained following stereotactic guided biopsy of calcifications. The COIL biopsy marking clip is in expected position at the site of biopsy. IMPRESSION: 1. Appropriate positioning of the X shaped biopsy marking clip at the site of biopsy in the upper breast at anterior to middle depth. 2. Appropriate positioning of the COIL shaped  biopsy marking clip at the site of biopsy in the upper outer breast at middle to posterior depth. 3. Clips are approximately 5.4 cm apart Final Assessment: Post Procedure Mammograms for Marker Placement Electronically Signed   By: Clancy Crimes M.D.   On: 02/24/2024 09:32   MM 3D DIAGNOSTIC MAMMOGRAM UNILATERAL RIGHT BREAST Result Date: 02/12/2024 CLINICAL DATA:  Screening recall for possible RIGHT breast architectural distortion and calcifications. Patient has strong family history of breast cancer including mother and sister diagnosed with cancer. EXAM: DIGITAL DIAGNOSTIC UNILATERAL RIGHT MAMMOGRAM WITH TOMOSYNTHESIS AND CAD; ULTRASOUND RIGHT BREAST LIMITED TECHNIQUE: Right digital diagnostic mammography and breast tomosynthesis was performed. The images were evaluated with computer-aided detection. ; Targeted ultrasound examination of the right breast was performed COMPARISON:  Previous exam(s). ACR Breast Density Category c: The breasts are heterogeneously dense, which may obscure small masses. FINDINGS: MAMMOGRAM: Spot tomosynthesis views demonstrate a persistent area of architectural distortion in the upper central right breast middle depth (spot CC image 29/54). This corresponds with the distortion seen on screening mammogram. Spot magnification views demonstrate pleomorphic calcifications in a regional distribution involving the upper-outer right breast anterior to middle depth, spanning up to 10 cm. These calcifications correspond the calcifications seen on screening mammogram and are not definitely stable compared to prior examinations. A questioned area of architectural distortion in the lower central right breast middle depth does not persist on additional spot tomosynthesis views, consistent with superimposed fibroglandular tissue. ULTRASOUND: Targeted right breast ultrasound was performed in the upper central position, with representative images taken at 11-12 o'clock 3 cm from nipple. This  demonstrates normal fibroglandular tissue. No sonographic correlate for the architectural distortion is identified. Confirmatory targeted right breast ultrasound was performed in the lower central position from 5-7 o'clock 5 cm from nipple. This demonstrates normal fibroglandular tissue. Targeted right axillary ultrasound demonstrates multiple morphologically benign lymph nodes. No lymphadenopathy. IMPRESSION: 1. RIGHT breast architectural distortion in the upper central position, without a sonographic correlate, is indeterminate. Recommend further assessment stereotactic guided biopsy. 2. RIGHT breast pleomorphic calcifications in a regional distribution in the upper-outer quadrant are indeterminate. Recommend further assessment with stereotactic guided biopsy of a distant area of calcifications. 3. No right lymphadenopathy. RECOMMENDATION: RIGHT breast stereotactic guided biopsy (2 site). I have discussed the findings and recommendations with the patient. The biopsy procedure was discussed with the patient and questions were answered. Patient expressed their understanding of the biopsy recommendation. Patient will be scheduled for biopsy at her earliest convenience by the schedulers. Ordering provider will be notified. If applicable, a reminder letter will be sent to the patient regarding the next appointment. BI-RADS CATEGORY  4: Suspicious. Electronically Signed   By: Sande Cromer M.D.   On: 02/12/2024 16:45   US  LIMITED ULTRASOUND INCLUDING AXILLA RIGHT BREAST Result Date: 02/12/2024 CLINICAL DATA:  Screening recall for possible RIGHT breast architectural distortion and calcifications. Patient has strong family history of breast cancer including mother and sister diagnosed with cancer. EXAM: DIGITAL DIAGNOSTIC UNILATERAL RIGHT MAMMOGRAM WITH TOMOSYNTHESIS AND CAD; ULTRASOUND RIGHT BREAST LIMITED TECHNIQUE: Right digital diagnostic mammography and breast tomosynthesis was performed. The images were  evaluated with computer-aided detection. ; Targeted ultrasound examination of the  right breast was performed COMPARISON:  Previous exam(s). ACR Breast Density Category c: The breasts are heterogeneously dense, which may obscure small masses. FINDINGS: MAMMOGRAM: Spot tomosynthesis views demonstrate a persistent area of architectural distortion in the upper central right breast middle depth (spot CC image 29/54). This corresponds with the distortion seen on screening mammogram. Spot magnification views demonstrate pleomorphic calcifications in a regional distribution involving the upper-outer right breast anterior to middle depth, spanning up to 10 cm. These calcifications correspond the calcifications seen on screening mammogram and are not definitely stable compared to prior examinations. A questioned area of architectural distortion in the lower central right breast middle depth does not persist on additional spot tomosynthesis views, consistent with superimposed fibroglandular tissue. ULTRASOUND: Targeted right breast ultrasound was performed in the upper central position, with representative images taken at 11-12 o'clock 3 cm from nipple. This demonstrates normal fibroglandular tissue. No sonographic correlate for the architectural distortion is identified. Confirmatory targeted right breast ultrasound was performed in the lower central position from 5-7 o'clock 5 cm from nipple. This demonstrates normal fibroglandular tissue. Targeted right axillary ultrasound demonstrates multiple morphologically benign lymph nodes. No lymphadenopathy. IMPRESSION: 1. RIGHT breast architectural distortion in the upper central position, without a sonographic correlate, is indeterminate. Recommend further assessment stereotactic guided biopsy. 2. RIGHT breast pleomorphic calcifications in a regional distribution in the upper-outer quadrant are indeterminate. Recommend further assessment with stereotactic guided biopsy of a  distant area of calcifications. 3. No right lymphadenopathy. RECOMMENDATION: RIGHT breast stereotactic guided biopsy (2 site). I have discussed the findings and recommendations with the patient. The biopsy procedure was discussed with the patient and questions were answered. Patient expressed their understanding of the biopsy recommendation. Patient will be scheduled for biopsy at her earliest convenience by the schedulers. Ordering provider will be notified. If applicable, a reminder letter will be sent to the patient regarding the next appointment. BI-RADS CATEGORY  4: Suspicious. Electronically Signed   By: Sande Cromer M.D.   On: 02/12/2024 16:45    ASSESSMENT: Clinical stage IIa ER/PR positive, HER2 negative invasive carcinoma of the right breast.  PLAN:    Clinical stage IIa ER/PR positive, HER2 negative invasive carcinoma of the right breast: Mammogram and ultrasound performed on February 12, 2024 revealed a span of 10 cm of abnormal calcifications.  Patient will likely benefit from a breast MRI to further define the lesion, but will defer to surgery if this is necessary.  We discussed lumpectomy versus mastectomy and given the size of the lesion, patient may benefit more from mastectomy.  She has an appointment with surgery on March 03, 2024.  It is unclear if she will require adjuvant XRT at this time. Given the stage of her disease, it is likely she will benefit from adjuvant chemotherapy, but will await final pathology to determine this.  Also, given the ER/PR status of her malignancy she will benefit from letrozole for a total of 5 years at the completion of her treatments.  Follow-up will be based on surgical recommendations.  Patient expressed understanding and was in agreement with this plan. She also understands that She can call clinic at any time with any questions, concerns, or complaints.    Cancer Staging  Invasive ductal carcinoma of right breast [facility]) Staging form: Breast, AJCC  8th Edition - Clinical stage from 02/28/2024: Stage IIA (cT3, cN0, cM0, G2, ER+, PR+, HER2-) - Signed by Shellie Dials, MD on 02/28/2024 Stage prefix: Initial diagnosis Histologic grading system: 3 grade system  Shellie Dials, MD   02/28/2024 9:31 AM

## 2024-02-28 ENCOUNTER — Encounter: Payer: Self-pay | Admitting: *Deleted

## 2024-02-28 DIAGNOSIS — C50911 Malignant neoplasm of unspecified site of right female breast: Secondary | ICD-10-CM | POA: Insufficient documentation

## 2024-02-28 HISTORY — DX: Malignant neoplasm of unspecified site of right female breast: C50.911

## 2024-02-28 NOTE — Progress Notes (Signed)
 Notified Angela Greer that the HER2 came back negative.    She is leaning toward having a mastectomy and will meet with Dr. Cornel Diesel on Tuesday.

## 2024-03-03 ENCOUNTER — Encounter: Payer: Self-pay | Admitting: General Surgery

## 2024-03-03 ENCOUNTER — Other Ambulatory Visit: Payer: Self-pay | Admitting: General Surgery

## 2024-03-03 ENCOUNTER — Ambulatory Visit (INDEPENDENT_AMBULATORY_CARE_PROVIDER_SITE_OTHER): Admitting: General Surgery

## 2024-03-03 ENCOUNTER — Ambulatory Visit: Payer: Self-pay | Admitting: General Surgery

## 2024-03-03 ENCOUNTER — Other Ambulatory Visit: Payer: Self-pay

## 2024-03-03 VITALS — BP 139/77 | HR 77 | Ht 63.5 in | Wt 180.0 lb

## 2024-03-03 DIAGNOSIS — C50911 Malignant neoplasm of unspecified site of right female breast: Secondary | ICD-10-CM | POA: Diagnosis not present

## 2024-03-03 DIAGNOSIS — D0511 Intraductal carcinoma in situ of right breast: Secondary | ICD-10-CM

## 2024-03-03 MED ORDER — LIDOCAINE-PRILOCAINE 2.5-2.5 % EX CREA: TOPICAL_CREAM | CUTANEOUS | 0 refills | Status: DC

## 2024-03-03 NOTE — Patient Instructions (Signed)
 We have spoken today about breast surgery. Your Mastectomy will be scheduled at [facility] with Dr. Cornel Diesel.  If you are on any injectable weight loss medication, you will need to stop taking your GLP-1 injectable (weight loss) medications 8 days before your surgery to avoid any complications with anesthesia.   You will most likely go home the same day but may spend at least 1 night in the hospital following surgery and go home with 1-2 drains for approximately 5-7 days following your surgery. Please keep an accurate record of your drain amount in ml's or cc's. If your drain suddenly stops draining or has drainage around the tube at the skin, call our office and speak with a nurse immediately.  Please see the (blue) pre-care surgery sheet that you have been given today for more information regarding your surgery. Our surgery scheduler will call you to look at surgery dates and to go over information about your surgery.   If you have FMLA or Disability paperwork that needs to be filled out, please have your company fax your paperwork to [phone] or you may drop this by either office. This paperwork will be filled out within 3 days after your surgery has been completed.  Information regarding your surgery has been provided below. If you have any questions or concerns, please call our office and speak with a nurse.  Total or Modified Radical Mastectomy A total mastectomy and a modified radical mastectomy are types of surgery for breast cancer. If you are having a total mastectomy (simple mastectomy), your entire breast will be removed. If you are having a modified radical mastectomy, your breast and nipple will be removed along with the lymph nodes under your arm. You may also have some of the lining over the muscle tissues under your breast removed. LET [facility] CARE PROVIDER KNOW ABOUT: Any allergies you have. All medicines you are taking, including vitamins, herbs, eye drops, creams, and  over-the-counter medicines. Previous problems you or members of your family have had with the use of anesthetics. Any blood disorders you have. Previous surgeries you have had. Medical conditions you have. RISKS AND COMPLICATIONS Generally, this is a safe procedure. However, problems may occur, including: Pain. Infection. Bleeding. Scar tissue. Chest numbness on the side of the surgery. Fluid buildup under the skin flaps where your breast was removed (seroma). Sensation of throbbing or tingling. Stress or sadness from losing your breast. If you have the lymph nodes under your arm removed, you may have arm swelling, weakness, or numbness on the same side of your body as your surgery. BEFORE THE PROCEDURE Ask your health care provider about: Changing or stopping your regular medicines. This is especially important if you are taking diabetes medicines or blood thinners. Taking medicines such as aspirin and ibuprofen. These medicines can thin your blood. Do not take these medicines before your procedure if your health care provider instructs you not to. Follow your health care provider's instructions about eating or drinking restrictions. Plan to have someone take you home after the procedure. PROCEDURE An IV tube will be inserted into one of your veins. You will be given a medicine that makes you fall asleep (general anesthetic). Your breast will be cleaned with a germ-killing solution (antiseptic). A wide incision will be made around your nipple. The skin and nipple inside the incision will be removed along with all breast tissue. If you are having a modified radical mastectomy: The lining over your chest muscles will be removed. The  incision may be extended to reach the lymph nodes under your arm, or a second incision may be made. The lymph nodes will be removed. You may have a drainage tube inserted into your incision to collect fluid that builds up after surgery. This tube is  connected to a suction bulb. Your incision or incisions will be closed with stitches (sutures). A bandage (dressing) will be placed over your breast and under your arm. The procedure may vary among health care providers and hospitals. AFTER THE PROCEDURE You will be moved to a recovery area. Your blood pressure, heart rate, breathing rate, and blood oxygen level will be monitored often until the medicines you were given have worn off. You will be given pain medicine as needed. After a while, you will be taken to a hospital room. You will be encouraged to get up and walk as soon as you can. Your IV tube can be removed when you are able to eat and drink. Your drain may be removed before you go home from the hospital, or you may be sent home with your drain and suction bulb.   This information is not intended to replace advice given to you by your health care provider. Make sure you discuss any questions you have with your health care provider.   Document Released: 07/17/2001 Document Revised: 11/12/2014 Document Reviewed: 07/07/2014 Elsevier Interactive Patient Education [employer]

## 2024-03-03 NOTE — Progress Notes (Signed)
 Patient ID: Angela Greer, female   DOB: 01/12/1951, 73 y.o.   MRN: [id number] CC: Right Breast Cancer History of Present Illness Angela Greer is a 73 y.o. female with past medical history as below who presents in consultation for right breast cancer.  The patient reports that she was in normal health when she had a right mammogram performed.  This was a screening mammogram and actually done bilaterally.  Her mammogram was concerning for architectural distortion in the right breast so she underwent diagnostic mammogram that showed 2 areas of concern.  She subsequently underwent stereotactic guided biopsy of these areas of concern.  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  She has been seen by oncology and presents today for discussion about treatment for her breast cancer.  She says that she did not notice any breast changes including no overlying skin changes or nipple retraction.  She has had no nipple discharge.  She has not had any pain in her right breast nor has she felt any dominant masses.  She also denies it feeling any dominant masses in her axilla.  Her biopsy results were consistent with an ER/PR positive tumor.  She has a history of using birth control.  She has undergone menopause.  She has a family history of breast cancer to include 2 sisters that were diagnosed with breast cancer.  She had her first menses at age 73.  She has never had any children..  Past Medical History Past Medical History:  Diagnosis Date   Acid reflux    Diverticulitis    Gout    Hypertension       Past Surgical History:  Procedure Laterality Date   BREAST BIOPSY Right 02/24/2024   Stereo bx, x-clip,path pending   BREAST BIOPSY Right 02/24/2024   Stereo bx, Coil Clip, path pending   BREAST BIOPSY Right 02/24/2024   MM RT BREAST BX W LOC DEV 1ST LESION IMAGE BX SPEC STEREO GUIDE 02/24/2024 [facility]   BREAST BIOPSY Right 02/24/2024   MM RT BREAST BX W  LOC DEV EA AD LESION IMG BX SPEC STEREO GUIDE 02/24/2024 [facility]   HERNIA REPAIR Right    inguinal   TOTAL HIP ARTHROPLASTY Bilateral     Allergies  Allergen Reactions   Pravastatin     REACTION: constipation   Pravastatin Sodium     REACTION: constipation   Simvastatin     REACTION: along with other statins causes myalgias.  CPK remains normal.    Current Outpatient Medications  Medication Sig Dispense Refill   acetaminophen (TYLENOL) 500 MG tablet Take by mouth.     allopurinol (ZYLOPRIM) 100 MG tablet Take 1 tablet by mouth daily.     Calcium-Magnesium-Vitamin D 600-40-500 MG-MG-UNIT TB24 2 tablets     Chlorpheniramine Maleate (ALLERGY RELIEF PO) 1 tablet     docusate sodium (STOOL SOFTENER) 100 MG capsule 1 capsule as needed     hydrochlorothiazide (HYDRODIURIL) 25 MG tablet 1 tablet in the morning     latanoprost (XALATAN) 0.005 % ophthalmic solution 1 drop at bedtime.     lidocaine -prilocaine (EMLA) cream Apply to the right areola and cover with plastic wrap one hour prior to leaving for surgery. 5 g 0   losartan (COZAAR) 25 MG tablet TAKE 1 TABLET BY MOUTH EVERY DAY FOR 30 DAYS     methocarbamol  (ROBAXIN ) 500 MG tablet Take 1 tablet (500 mg total) by mouth 2 (two) times daily as needed for  muscle spasms. 10 tablet 0   montelukast (SINGULAIR) 10 MG tablet Take 10 mg by mouth daily.     Omega-3 Fatty Acids (FISH OIL) 1000 MG CAPS 1 capsule.     OVER THE COUNTER MEDICATION Unable to remember names of medicines and medicines are not with her.   Blood pressure medicine Gout medicine Acid reflux     pantoprazole (PROTONIX) 40 MG tablet 1 tablet Orally Once a day for 100 days for acid reflux     rosuvastatin (CRESTOR) 10 MG tablet Take 10 mg by mouth at bedtime.     No current facility-administered medications for this visit.    Family History Family History  Problem Relation Age of Onset   Breast cancer Sister    Breast cancer Sister    Breast cancer Paternal  Aunt    Breast cancer Paternal Aunt    Breast cancer Paternal Aunt        Social History Social History   Tobacco Use   Smoking status: Former    Passive exposure: Past   Smokeless tobacco: Never  Vaping Use   Vaping status: Never Used  Substance Use Topics   Alcohol use: No   Drug use: No       ROS Full ROS of systems performed and is otherwise negative there than what is stated in the HPI  Physical Exam Blood pressure 139/77, pulse 77, height 5' 3.5" (1.613 m), weight 180 lb (81.6 kg), SpO2 100%.  Alert and oriented x 3, normal work of breathing on room air, abdomen is soft, nontender nondistended, regular rate and rhythm, mood and affect appropriate.  I performed a breast exam in the presence of a chaperone.  On her left breast there is no overlying skin changes or nipple retraction.  There is no nipple discharge.  She has no left axillary lymphadenopathy.  On her right breast there is some bruising at the areas of biopsy.  This is in the upper outer quadrant and the outer lower quadrant where the biopsy insertion needles were.  This is tender to palpation over where the bruises are.  She has no dominant masses.  There are no dimpling of the skin or nipple discharge.  She has no right axillary lymphadenopathy.  Data Reviewed I have reviewed her mammograms.  There are 2 areas that were biopsied.  Clips were placed in this.  The pathology I have also reviewed which is consistent with invasive ductal carcinoma as well as ductal carcinoma in situ.  I have also reviewed her oncologist note and the fact that she may benefit from radiation or chemotherapy in the adjuvant setting.  Given her hormonal status she will also benefit for hormone therapy.  I have personally reviewed the patient's imaging and medical records.    Assessment/Plan    The patient is a 73 year old with a clinical T3 clinical N0 invasive ductal carcinoma as well as ductal carcinoma in situ from biopsy who presents  today in consultation for surgical management of her breast cancer.  I briefly explained her clinical course to her and the treatment options for breast cancer.  I discussed with her that given she has 2 foci of abnormalities it may be difficult to perform a lumpectomy.  She has chosen to proceed with a total mastectomy.  I discussed with her the surgical management of breast cancer to include total mastectomy and sentinel lymph node biopsy.  She may benefit from XRT as well as chemotherapy but that will depend on  the nodal disease burden and the pathological features of her tumor.  I also discussed with her that if there is a large disease burden in her axilla then we may need to perform a complete axillary lymphadenectomy.  I also discussed with her that I would be happy to refer her to plastic surgery for reconstructive options.  At this time she is elected not to proceed with any reconstruction.  We discussed the role of bilateral mastectomies for her but she has opted to just do the unilateral right breast.  I discussed the risk, benefits and alternatives of the procedure including risk of infection, bleeding, positive margins, pain and poor cosmetic outcomes.  She understands all these risks and wishes to proceed  A total of 60 minutes was spent discussing the patient's history and physical, performing a physical exam and coming in agreement to treatment options for the patient.  We will plan to proceed with surgery on May 19.  She will need to have the nuclear tracer injected by radiology prior to the procedure.     Barrett Lick 03/03/2024, 1:41 PM

## 2024-03-04 ENCOUNTER — Encounter: Payer: Self-pay | Admitting: *Deleted

## 2024-03-04 ENCOUNTER — Telehealth: Payer: Self-pay | Admitting: General Surgery

## 2024-03-04 ENCOUNTER — Encounter: Payer: Self-pay | Admitting: Occupational Therapy

## 2024-03-04 ENCOUNTER — Ambulatory Visit: Attending: Oncology | Admitting: Occupational Therapy

## 2024-03-04 DIAGNOSIS — R293 Abnormal posture: Secondary | ICD-10-CM | POA: Diagnosis present

## 2024-03-04 NOTE — Progress Notes (Signed)
 Mastectomy is scheduled for 5/19.   Angela Greer will see Dr. Adrian Alba back on 6/10.   Appt. Details given to her.

## 2024-03-04 NOTE — Therapy (Signed)
 OUTPATIENT OCCUPATIONAL THERAPY BREAST CANCER BASELINE EVALUATION   Patient Name: Angela Greer MRN: [id number] DOB:02-13-1951, 73 y.o., female Today's Date: 03/04/2024  END OF SESSION:  OT End of Session - 03/04/24 1716     Visit Number 1    Number of Visits 6    Date for OT Re-Evaluation 05/27/24    OT Start Time 1502    OT Stop Time 1534    OT Time Calculation (min) 32 min             Past Medical History:  Diagnosis Date   Acid reflux    Diverticulitis    Gout    Hypertension    Past Surgical History:  Procedure Laterality Date   BREAST BIOPSY Right 02/24/2024   Stereo bx, x-clip,path pending   BREAST BIOPSY Right 02/24/2024   Stereo bx, Coil Clip, path pending   BREAST BIOPSY Right 02/24/2024   MM RT BREAST BX W LOC DEV 1ST LESION IMAGE BX SPEC STEREO GUIDE 02/24/2024 [facility]   BREAST BIOPSY Right 02/24/2024   MM RT BREAST BX W LOC DEV EA AD LESION IMG BX SPEC STEREO GUIDE 02/24/2024 [facility]   HERNIA REPAIR Right    inguinal   TOTAL HIP ARTHROPLASTY Bilateral    Patient Active Problem List   Diagnosis Date Noted   Invasive ductal carcinoma of right breast (HCC) 02/28/2024   HYPERLIPIDEMIA, MIXED 05/17/2007   OBESITY 05/17/2007   ULNAR NEUROPATHY, RIGHT 05/17/2007   ALLERGIC RHINITIS 05/17/2007   GASTROESOPHAGEAL REFLUX DISEASE 05/17/2007   HIATAL HERNIA 05/17/2007   CYSTITIS NOS 05/17/2007   DEGENERATIVE JOINT DISEASE, HIPS 05/17/2007   DUPUYTREN'S CONTRACTURE, RIGHT 05/17/2007   AVASCULAR NECROSIS, FEMORAL HEAD 05/17/2007   ABNORMAL PAP SMEAR 11/05/1988    PCP: Dr Gloria Lares  REFERRING PROVIDER: Dr   Waunita Haff DIAG: DR Adrian Alba  THERAPY DIAG:  Abnormal posture  Rationale for Evaluation and Treatment: Rehabilitation  ONSET DATE: 02/24/24  SUBJECTIVE:                                                                                                                                                                                            SUBJECTIVE STATEMENT: Patient reports she is here today after being refer by one of her medical team for her newly diagnosed right breast cancer.   PERTINENT HISTORY:  Patient was diagnosed with right  breast cancer - plan is to have R mastectomy   PATIENT GOALS:   reduce lymphedema risk and learn post op HEP.   PAIN:  Are you having pain? No  PRECAUTIONS: Active CA      HAND DOMINANCE: right  WEIGHT BEARING RESTRICTIONS: No  FALLS:  Has patient fallen in last 6 months? No  LIVING ENVIRONMENT: Patient lives with: Alone   OCCUPATION: Retired  LEISURE: Scientist, physiological, do some cooking and cleaning -apartment   OBJECTIVE:  COGNITION: Overall cognitive status: Within functional limits for tasks assessed    POSTURE:  Forward head and rounded shoulders posture  UPPER EXTREMITY AROM/PROM:  A/PROM RIGHT   eval   Shoulder extension   Shoulder flexion 135  Shoulder abduction 135 scaption  Shoulder internal rotation   Shoulder external rotation 65    (Blank rows = not tested)  A/PROM LEFT   eval  Shoulder extension   Shoulder flexion 150  Shoulder abduction 135 scaption  Shoulder internal rotation   Shoulder external rotation 55    (Blank rows = not tested)  CERVICAL AROM: Forward head    UPPER EXTREMITY STRENGTH: 5-/5 at 90 degrees of shoulder AROM -and ext and int at side  LYMPHEDEMA ASSESSMENTS:   LANDMARK RIGHT   eval  10 cm proximal to olecranon process 38  Olecranon process 34  10 cm proximal to ulnar styloid process   Just proximal to ulnar styloid process   Across hand at thumb web space   At base of 2nd digit   (Blank rows = not tested)  LANDMARK LEFT   eval  10 cm proximal to olecranon process 35.2  Olecranon process 32.7  10 cm proximal to ulnar styloid process   Just proximal to ulnar styloid process   Across hand at thumb web space   At base of 2nd digit   (Blank rows = not tested)  L-DEX LYMPHEDEMA SCREENING:  The  patient was assessed using the L-Dex machine today to produce a lymphedema index baseline score. The patient will be reassessed on a regular basis (typically every 3 months) to obtain new L-Dex scores. If the score is > 6.5 points away from his/her baseline score indicating onset of subclinical lymphedema, it will be recommended to wear a compression garment for 4 weeks, 12 hours per day and then be reassessed. If the score continues to be > 6.5 points from baseline at reassessment, we will initiate lymphedema treatment. Assessing in this manner has a 95% rate of preventing clinically significant lymphedema.   L-DEX FLOWSHEETS - 03/04/24 1700       L-DEX LYMPHEDEMA SCREENING   Measurement Type Unilateral    L-DEX MEASUREMENT EXTREMITY Upper Extremity    POSITION  Standing    DOMINANT SIDE Right    At Risk Side Right    BASELINE SCORE (UNILATERAL) 1.2               PATIENT EDUCATION:  Education details: Lymphedema risk reduction and post op shoulder/posture HEP Person educated: Patient Education method: Explanation, Demonstration, Handout Education comprehension: Patient verbalized understanding and returned demonstration  HOME EXERCISE PROGRAM: Patient was instructed today in a home exercise program today for post op shoulder range of motion. These included active assist shoulder flexion and abduction supine, and ext rotation - 10 reps - 3 x day - scapular retraction, and if okay by surgeon can go over head -  She was encouraged to do these 2-3 x day, holding 3 seconds and repeating 10 times when permitted by her physician/surgeon.   ASSESSMENT:  CLINICAL IMPRESSION: Her multidisciplinary medical team has met to assess and determine a recommended treatment plan. She is planning to have R mastectomy by Dr Cornel Diesel . She will benefit from a post op OT  reassessment to determine needs and from L-Dex screens every 3 months for 2 years to detect subclinical lymphedema.  Pt will benefit  from skilled therapeutic intervention to improve on the following deficits: Decreased knowledge of precautions and lymphedema education, impaired UE functional use, pain, decreased ROM, postural dysfunction.   OT treatment/interventions: ADL/self-care home management, pt/family education, therapeutic exercise,manual therapy  REHAB POTENTIAL: Good  CLINICAL DECISION MAKING: Stable/uncomplicated  EVALUATION COMPLEXITY: Low   GOALS: Goals reviewed with patient? YES  LONG TERM GOALS: (STG=LTG)    Name Target Date Goal status  1 Pt will be able to verbalize understanding of pertinent lymphedema risk reduction practices relevant to her dx specifically related to skin care.  Baseline:  No knowledge 12 wks initial  2 Pt will be able to return demo and/or verbalize understanding of the post op HEP related to regaining shoulder ROM. Baseline:  No knowledge 12 wks  progressing       4 Pt will demo she has regained full shoulder ROM and function post operatively compared to baselines.  Baseline: See objective measurements taken today. 12 wks Initial    PLAN:  OT FREQUENCY/DURATION: EVAL and 6 follow up appointment dep on progress.   PLAN FOR NEXT SESSION: will reassess 3-4 weeks post op to determine needs.   Patient will follow up at outpatient cancer rehab 3-4 weeks following surgery.  If the patient requires occupational therapy at that time, a specific plan will be dictated and sent to the referring physician for approval. T Occupational Therapy Information for After Breast Cancer Surgery/Treatment:  Lymphedema is a swelling condition that you may be at risk for in your arm if you have lymph nodes removed from the armpit area.  After a sentinel node biopsy, the risk is approximately 5-9% and is higher after an axillary node dissection.  There is treatment available for this condition and it is not life-threatening.  Contact your physician or occupational therapist with concerns. You may  begin the 4 shoulder/posture exercises (see additional sheet) when permitted by your physician (typically a week after surgery).  If you have drains, you may need to wait until those are removed before beginning range of motion exercises.  A general recommendation is to not lift your arms above shoulder height until drains are removed.  These exercises should be done to your tolerance and gently.  This is not a "no pain/no gain" type of recovery so listen to your body and stretch into the range of motion that you can tolerate, stopping if you have pain.  If you are having immediate reconstruction, ask your plastic surgeon about doing exercises as he or she may want you to wait. .  While undergoing any medical procedure or treatment, try to avoid blood pressure being taken or needle sticks from occurring on the arm on the side of cancer.   This recommendation begins after surgery and continues for the rest of your life.  This may help reduce your risk of getting lymphedema (swelling in your arm). An excellent resource for those seeking information on lymphedema is the National Lymphedema Network's web site. It can be accessed at www.lymphnet.org If you notice swelling in your hand, arm or breast at any time following surgery (even if it is many years from now), please contact your doctor or occupational therapist to discuss this.  Lymphedema can be treated at any time but it is easier for you if it is treated early on.  If you feel like your shoulder motion  is not returning to normal in a reasonable amount of time, please contact your surgeon or occupational therapist.  [facility] [phone]. [address], [place], [place] [address]      Heloise Lobo, OTR/L,CLT 03/04/2024, 5:18 PM

## 2024-03-04 NOTE — Telephone Encounter (Signed)
 Patient has been advised of Pre-Admission date/time, and Surgery date at [facility].  Surgery Date: 03/23/24 Preadmission Testing Date: 03/16/24 (phone 1p-4p)  Patient informed of the scheduling process and given surgery information at time of office visit.   Patient has already been given arrival time of 7:15 am on 03/23/24 as will be having SLN bx injection done first prior to surgery.

## 2024-03-16 ENCOUNTER — Other Ambulatory Visit: Payer: Self-pay

## 2024-03-16 ENCOUNTER — Encounter
Admission: RE | Admit: 2024-03-16 | Discharge: 2024-03-16 | Disposition: A | Discharge status: Home / Self Care | Source: Ambulatory Visit | Attending: General Surgery | Admitting: General Surgery

## 2024-03-16 ENCOUNTER — Telehealth: Payer: Self-pay | Admitting: Urgent Care

## 2024-03-16 VITALS — BP 128/67 | HR 58 | Temp 98.2°F | Resp 18 | Ht 63.5 in | Wt 180.0 lb

## 2024-03-16 DIAGNOSIS — Z79899 Other long term (current) drug therapy: Secondary | ICD-10-CM | POA: Diagnosis not present

## 2024-03-16 DIAGNOSIS — E876 Hypokalemia: Secondary | ICD-10-CM

## 2024-03-16 DIAGNOSIS — Z01818 Encounter for other preprocedural examination: Secondary | ICD-10-CM | POA: Diagnosis present

## 2024-03-16 DIAGNOSIS — Z01812 Encounter for preprocedural laboratory examination: Secondary | ICD-10-CM

## 2024-03-16 DIAGNOSIS — T502X5A Adverse effect of carbonic-anhydrase inhibitors, benzothiadiazides and other diuretics, initial encounter: Secondary | ICD-10-CM | POA: Insufficient documentation

## 2024-03-16 DIAGNOSIS — E782 Mixed hyperlipidemia: Secondary | ICD-10-CM | POA: Diagnosis not present

## 2024-03-16 DIAGNOSIS — C50911 Malignant neoplasm of unspecified site of right female breast: Secondary | ICD-10-CM

## 2024-03-16 HISTORY — DX: Personal history of other diseases of the digestive system: Z87.19

## 2024-03-16 HISTORY — DX: Nausea with vomiting, unspecified: R11.2

## 2024-03-16 LAB — BASIC METABOLIC PANEL WITH GFR
Anion gap: 8 (ref 5–15)
BUN: 20 mg/dL (ref 8–23)
CO2: 27 mmol/L (ref 22–32)
Calcium: 10 mg/dL (ref 8.9–10.3)
Chloride: 102 mmol/L (ref 98–111)
Creatinine, Ser: 0.7 mg/dL (ref 0.44–1.00)
GFR, Estimated: >60 mL/min (ref >60)
Glucose, Bld: 90 mg/dL (ref 70–99)
Potassium: 2.9 mmol/L — ABNORMAL LOW (ref 3.5–5.1)
Sodium: 137 mmol/L (ref 135–145)

## 2024-03-16 LAB — CBC
HCT: 43 % (ref 36.0–46.0)
Hemoglobin: 14.6 g/dL (ref 12.0–15.0)
MCH: 30.7 pg (ref 26.0–34.0)
MCHC: 34 g/dL (ref 30.0–36.0)
MCV: 90.3 fL (ref 80.0–100.0)
Platelets: 191 10*3/uL (ref 150–400)
RBC: 4.76 MIL/uL (ref 3.87–5.11)
RDW: 13.5 % (ref 11.5–15.5)
WBC: 6.9 10*3/uL (ref 4.0–10.5)
nRBC: 0 % (ref 0.0–0.2)

## 2024-03-16 MED ORDER — POTASSIUM CHLORIDE CRYS ER 20 MEQ PO TBCR: EXTENDED_RELEASE_TABLET | ORAL | 0 refills | Status: DC

## 2024-03-16 NOTE — Patient Instructions (Addendum)
 Your procedure is scheduled on:  MONDAY MAY 19  Report to the Registration Desk on the 1st floor of the [facility]. To find out your arrival time, please call [phone] between 1PM - 3PM on:  FRIDAY MAY 16  If your arrival time is 6:00 am, do not arrive before that time as the [facility] entrance doors do not open until 6:00 am.  REMEMBER: Instructions that are not followed completely may result in serious medical risk, up to and including death; or upon the discretion of your surgeon and anesthesiologist your surgery may need to be rescheduled.  Do not eat food after midnight the night before surgery.  No gum chewing or hard candies.  One week prior to surgery: MONDAY MAY 12  Stop Anti-inflammatories (NSAIDS) such as Advil, Aleve, Ibuprofen, Motrin, Naproxen, Naprosyn and Aspirin based products such as Excedrin, Goody's Powder, BC Powder. Stop ANY OVER THE COUNTER supplements until after surgery. Calcium-Magnesium-Vitamin D  Omega-3 Fatty Acids (FISH OIL)  You may however, continue to take Tylenol if needed for pain up until the day of surgery.  Continue taking all of your other prescription medications up until the day of surgery.  ON THE DAY OF SURGERY ONLY TAKE THESE MEDICATIONS WITH SIPS OF WATER:  pantoprazole (PROTONIX)   No Alcohol for 24 hours before or after surgery.  No Smoking including e-cigarettes for 24 hours before surgery.  No chewable tobacco products for at least 6 hours before surgery.  No nicotine patches on the day of surgery.  Do not use any "recreational" drugs for at least a week (preferably 2 weeks) before your surgery.  Please be advised that the combination of cocaine and anesthesia may have negative outcomes, up to and including death. If you test positive for cocaine, your surgery will be cancelled.  On the morning of surgery brush your teeth with toothpaste and water, you may rinse your mouth with mouthwash if you wish. Do not swallow any  toothpaste or mouthwash.  Use CHG Soap as directed on instruction sheet.  Do not wear jewelry, make-up, hairpins, clips or nail polish.  For welded (permanent) jewelry: bracelets, anklets, waist bands, etc.  Please have this removed prior to surgery.  If it is not removed, there is a chance that hospital personnel will need to cut it off on the day of surgery.  Do not wear lotions, powders, or perfumes.   Do not shave body hair from the neck down 48 hours before surgery.  Contact lenses, hearing aids and dentures may not be worn into surgery.  Do not bring valuables to the hospital. [facility] is not responsible for any missing/lost belongings or valuables.   Notify your doctor if there is any change in your medical condition (cold, fever, infection).  Wear comfortable clothing (specific to your surgery type) to the hospital.  After surgery, you can help prevent lung complications by doing breathing exercises.  Take deep breaths and cough every 1-2 hours.   If you are being admitted to the hospital overnight, leave your suitcase in the car. After surgery it may be brought to your room.  In case of increased patient census, it may be necessary for you, the patient, to continue your postoperative care in the Same Day Surgery department.  If you are being discharged the day of surgery, you will not be allowed to drive home. You will need a responsible individual to drive you home and stay with you for 24 hours after surgery.  If you are taking public transportation, you will need to have a responsible individual with you.  Please call the Pre-admissions Testing Dept. at [phone] if you have any questions about these instructions.  Surgery Visitation Policy:  Patients having surgery or a procedure may have two visitors.  Children under the age of 51 must have an adult with them who is not the patient.  Inpatient Visitation:    Visiting hours are 7 a.m. to 8 p.m. Up to  four visitors are allowed at one time in a patient room. The visitors may rotate out with other people during the day.  One visitor age 31 or older may stay with the patient overnight and must be in the room by 8 p.m.        Preparing for Surgery with CHLORHEXIDINE GLUCONATE (CHG) Soap  Chlorhexidine Gluconate (CHG) Soap  o An antiseptic cleaner that kills germs and bonds with the skin to continue killing germs even after washing  o Used for showering the night before surgery and morning of surgery  Before surgery, you can play an important role by reducing the number of germs on your skin.  CHG (Chlorhexidine gluconate) soap is an antiseptic cleanser which kills germs and bonds with the skin to continue killing germs even after washing.  Please do not use if you have an allergy to CHG or antibacterial soaps. If your skin becomes reddened/irritated stop using the CHG.  1. Shower the NIGHT BEFORE SURGERY and the MORNING OF SURGERY with CHG soap.  2. If you choose to wash your hair, wash your hair first as usual with your normal shampoo.  3. After shampooing, rinse your hair and body thoroughly to remove the shampoo.  4. Use CHG as you would any other liquid soap. You can apply CHG directly to the skin and wash gently with a scrungie or a clean washcloth.  5. Apply the CHG soap to your body only from the neck down. Do not use on open wounds or open sores. Avoid contact with your eyes, ears, mouth, and genitals (private parts). Wash face and genitals (private parts) with your normal soap.  6. Wash thoroughly, paying special attention to the area where your surgery will be performed.  7. Thoroughly rinse your body with warm water.  8. Do not shower/wash with your normal soap after using and rinsing off the CHG soap.  9. Pat yourself dry with a clean towel.  10. Wear clean pajamas to bed the night before surgery.  11. Place clean sheets on your bed the night of your first shower  and do not sleep with pets.  12. Shower again with the CHG soap on the day of surgery prior to arriving at the hospital.  13. Do not apply any deodorants/lotions/powders.  14. Please wear clean clothes to the hospital.

## 2024-03-16 NOTE — Progress Notes (Signed)
 [facility] Perioperative Services: Pre-Admission/Anesthesia Testing  Abnormal Lab Notification and Treatment Plan of Care   Date: 03/16/24  Name: Angela Greer MRN:   [id number]  Re: Abnormal labs noted during PAT appointment   Notified:  Provider Name Provider Role Notification Mode  Severa Daniels, MD General Surgery (Surgeon) Routed and/or faxed via [facility]  Arlen Belton, MD Primary Care Provider Routed and/or faxed via [facility]   Clinical Information and Notes:  ABNORMAL LAB VALUE(S): Lab Results  Component Value Date   K 2.9 (L) 03/16/2024   Angela Greer is scheduled for a RIGHT MASTECTOMY WITH SENTINEL LYMPH NODE BIOPSY on 03/23/2024. In review of her medication reconciliation, it is noted that the patient is taking prescribed diuretic medications (HCTZ 25 mg) daily.   Please note, in efforts to promote a safe and effective anesthetic course, per current guidelines/standards set by the [facility] anesthesia team, the minimal acceptable K+ level for the patient to proceed with general anesthesia is 3.0 mmol/L. With that being said, if the patient drops any lower, her elective procedure will need to be postponed until K+ is better optimized. In efforts to prevent case cancellation, and ultimately to promote the safety of this patient undergoing sedation/anesthesia, will make efforts to optimize pre-surgical K+ level allowing the surgical intervention to proceed as planned.    Impression and Plan:  Angela Greer found to be HYPOkalemic at 2.9 mmol/L on preoperative labs.   Mg level was added and found to be normal at: 1.9 mg/dL  She is on daily thiazide diuretic therapy.  Patient has a history of hypokalemia in the past, however it does not appear as if she is taking a daily K+ supplement. Discussed diuretic therapy as likely etiology in the setting of normal Mg level and in the absence of GI related symptoms (no diarrhea). Patient denies regular  use of laxative medications. Reviewed other potential causes, including decreased intake of dietary K+ and other potential insensible losses. Reviewed plans for preoperative optimization as follows:   Meds ordered this encounter  Medications   potassium chloride SA (KLOR-CON M) 20 MEQ tablet    Sig: Take 2 tablets (40 mEq) today, then 1 tablet (20 mEq) daily until surgery. Be sure to take dose on day of surgery. Follow up with PCP for repeat labs.    Dispense:  8 tablet    Refill:  0    Please contact the patient as soon as it is available for pickup. Rx is for preoperative K+ optimization and needs to be started ASAP.   Encouraged patient to follow up with PCP about 2-3 weeks postoperatively to have labs rechecked to ensure that levels are remaining within normal range. Discussed nutritional intake of K+ rich foods as an adjunctive way to keep her K+ levels normal; list of K+ rich foods provided. Also mentioned ORS, however advised her not to rely solely on these drinks, as they are high in Na+, and she has a HTN diagnosis.   Will send copy of this note to surgeon and PCP to make them aware of K+ level and plans for correction. Discussed that PCP may elect to pursue a change in diuretic therapy to a K+ sparing type medication, or alternatively, they may consider adding a daily K+ supplement if levels remain low on recheck. Order entered to recheck K+ on the day of her surgery to ensure optimization. Wished patient the best of luck with her upcoming surgery and subsequent recovery. She  was encouraged to return call to the PAT clinic, or to her surgeon's office, should any questions or concerns arise between now and the time of her surgery.   Encounter Diagnoses  Name Primary?   Pre-operative laboratory examination Yes   Diuretic-induced hypokalemia    Long term current use of diuretic    Angela Truxillo, MSN, APRN, FNP-C, CEN [facility]  Perioperative Services Nurse  Practitioner Phone: [phone] 03/16/24 6:37 PM  NOTE: This note has been prepared using Dragon dictation software. Despite my best ability to proofread, there is always the potential that unintentional transcriptional errors may still occur from this process.

## 2024-03-17 LAB — MAGNESIUM: Magnesium: 1.9 mg/dL (ref 1.7–2.4)

## 2024-03-23 ENCOUNTER — Encounter: Payer: Self-pay | Admitting: General Surgery

## 2024-03-23 ENCOUNTER — Ambulatory Visit
Admission: RE | Admit: 2024-03-23 | Discharge: 2024-03-23 | Disposition: A | Discharge status: Home / Self Care | Source: Ambulatory Visit | Attending: General Surgery | Admitting: General Surgery

## 2024-03-23 ENCOUNTER — Observation Stay
Admission: RE | Admit: 2024-03-23 | Discharge: 2024-03-24 | Disposition: A | Discharge status: Home / Self Care | Source: Ambulatory Visit | Attending: General Surgery | Admitting: General Surgery

## 2024-03-23 ENCOUNTER — Encounter
Admission: RE | Disposition: A | Discharge status: Home / Self Care | Payer: Self-pay | Source: Ambulatory Visit | Attending: General Surgery

## 2024-03-23 ENCOUNTER — Ambulatory Visit: Payer: Self-pay | Admitting: Urgent Care

## 2024-03-23 ENCOUNTER — Ambulatory Visit: Admitting: Certified Registered"

## 2024-03-23 ENCOUNTER — Other Ambulatory Visit: Payer: Self-pay

## 2024-03-23 DIAGNOSIS — I10 Essential (primary) hypertension: Secondary | ICD-10-CM | POA: Insufficient documentation

## 2024-03-23 DIAGNOSIS — Z01812 Encounter for preprocedural laboratory examination: Secondary | ICD-10-CM

## 2024-03-23 DIAGNOSIS — Z1231 Encounter for screening mammogram for malignant neoplasm of breast: Secondary | ICD-10-CM | POA: Diagnosis not present

## 2024-03-23 DIAGNOSIS — C50911 Malignant neoplasm of unspecified site of right female breast: Secondary | ICD-10-CM | POA: Insufficient documentation

## 2024-03-23 DIAGNOSIS — Z96643 Presence of artificial hip joint, bilateral: Secondary | ICD-10-CM | POA: Insufficient documentation

## 2024-03-23 DIAGNOSIS — Z853 Personal history of malignant neoplasm of breast: Secondary | ICD-10-CM

## 2024-03-23 DIAGNOSIS — Z79899 Other long term (current) drug therapy: Secondary | ICD-10-CM | POA: Insufficient documentation

## 2024-03-23 DIAGNOSIS — D0511 Intraductal carcinoma in situ of right breast: Secondary | ICD-10-CM

## 2024-03-23 DIAGNOSIS — E876 Hypokalemia: Secondary | ICD-10-CM

## 2024-03-23 HISTORY — PX: MASTECTOMY W/ SENTINEL NODE BIOPSY: SHX2001

## 2024-03-23 LAB — POCT I-STAT, CHEM 8
BUN: 20 mg/dL (ref 8–23)
Calcium, Ion: 1.22 mmol/L (ref 1.15–1.40)
Chloride: 103 mmol/L (ref 98–111)
Creatinine, Ser: 0.8 mg/dL (ref 0.44–1.00)
Glucose, Bld: 88 mg/dL (ref 70–99)
HCT: 46 % (ref 36.0–46.0)
Hemoglobin: 15.6 g/dL — ABNORMAL HIGH (ref 12.0–15.0)
Potassium: 3.4 mmol/L — ABNORMAL LOW (ref 3.5–5.1)
Sodium: 139 mmol/L (ref 135–145)
TCO2: 28 mmol/L (ref 22–32)

## 2024-03-23 SURGERY — MASTECTOMY WITH SENTINEL LYMPH NODE BIOPSY
Anesthesia: General | Site: Breast | Laterality: Right

## 2024-03-23 MED ORDER — FENTANYL CITRATE PF 50 MCG/ML IJ SOSY: 50.0000 ug | PREFILLED_SYRINGE | INTRAMUSCULAR | Status: DC | PRN

## 2024-03-23 MED ORDER — MONTELUKAST SODIUM 10 MG PO TABS
10.0000 mg | ORAL_TABLET | Freq: Every day | ORAL | Status: DC
  Administered 2024-03-23 – 2024-03-24 (×2): 10 mg via ORAL
  Filled 2024-03-23 (×2): qty 1

## 2024-03-23 MED ORDER — POLYVINYL ALCOHOL 1.4 % OP SOLN: 1.0000 [drp] | Freq: Three times a day (TID) | OPHTHALMIC | Status: DC | PRN

## 2024-03-23 MED ORDER — PHENYLEPHRINE HCL-NACL 20-0.9 MG/250ML-% IV SOLN
INTRAVENOUS | Status: DC | PRN
  Administered 2024-03-23: 25 ug/min via INTRAVENOUS

## 2024-03-23 MED ORDER — GABAPENTIN 300 MG PO CAPS
ORAL_CAPSULE | ORAL | Status: AC
Start: 2024-03-23 — End: ?
  Filled 2024-03-23: qty 1

## 2024-03-23 MED ORDER — PHENYLEPHRINE 80 MCG/ML (10ML) SYRINGE FOR IV PUSH (FOR BLOOD PRESSURE SUPPORT)
PREFILLED_SYRINGE | INTRAVENOUS | Status: DC | PRN
  Administered 2024-03-23 (×2): 160 ug via INTRAVENOUS

## 2024-03-23 MED ORDER — FENTANYL CITRATE (PF) 100 MCG/2ML IJ SOLN
INTRAMUSCULAR | Status: DC | PRN
  Administered 2024-03-23: 50 ug via INTRAVENOUS

## 2024-03-23 MED ORDER — HYDROMORPHONE HCL 1 MG/ML IJ SOLN
INTRAMUSCULAR | Status: AC
  Filled 2024-03-23: qty 1

## 2024-03-23 MED ORDER — ORAL CARE MOUTH RINSE: 15.0000 mL | Freq: Once | OROMUCOSAL | Status: AC

## 2024-03-23 MED ORDER — SODIUM CHLORIDE (PF) 0.9 % IJ SOLN
INTRAMUSCULAR | Status: DC | PRN
  Administered 2024-03-23: 40 mL via INTRAMUSCULAR

## 2024-03-23 MED ORDER — FENTANYL CITRATE (PF) 100 MCG/2ML IJ SOLN
INTRAMUSCULAR | Status: AC
  Filled 2024-03-23: qty 2

## 2024-03-23 MED ORDER — GABAPENTIN 300 MG PO CAPS
300.0000 mg | ORAL_CAPSULE | Freq: Once | ORAL | Status: AC
  Administered 2024-03-23: 300 mg via ORAL

## 2024-03-23 MED ORDER — ONDANSETRON HCL 4 MG/2ML IJ SOLN
INTRAMUSCULAR | Status: DC | PRN
  Administered 2024-03-23 (×2): 4 mg via INTRAVENOUS

## 2024-03-23 MED ORDER — CEFAZOLIN SODIUM-DEXTROSE 2-4 GM/100ML-% IV SOLN
2.0000 g | INTRAVENOUS | Status: AC
  Administered 2024-03-23: 2 g via INTRAVENOUS

## 2024-03-23 MED ORDER — ONDANSETRON HCL 4 MG/2ML IJ SOLN
4.0000 mg | INTRAMUSCULAR | Status: DC | PRN
  Administered 2024-03-23: 4 mg via INTRAVENOUS
  Filled 2024-03-23: qty 2

## 2024-03-23 MED ORDER — DOCUSATE SODIUM 100 MG PO CAPS
100.0000 mg | ORAL_CAPSULE | Freq: Every day | ORAL | Status: DC | PRN
  Filled 2024-03-23: qty 1

## 2024-03-23 MED ORDER — ACETAMINOPHEN 10 MG/ML IV SOLN: 1000.0000 mg | Freq: Once | INTRAVENOUS | Status: DC | PRN

## 2024-03-23 MED ORDER — MORPHINE SULFATE (PF) 2 MG/ML IV SOLN: 2.0000 mg | INTRAVENOUS | Status: DC | PRN

## 2024-03-23 MED ORDER — SODIUM CHLORIDE (PF) 0.9 % IJ SOLN
INTRAMUSCULAR | Status: AC
  Filled 2024-03-23: qty 20

## 2024-03-23 MED ORDER — MIDAZOLAM HCL 2 MG/2ML IJ SOLN
INTRAMUSCULAR | Status: DC | PRN
  Administered 2024-03-23: 2 mg via INTRAVENOUS

## 2024-03-23 MED ORDER — FENTANYL CITRATE (PF) 100 MCG/2ML IJ SOLN
25.0000 ug | INTRAMUSCULAR | Status: DC | PRN
  Administered 2024-03-23: 25 ug via INTRAVENOUS

## 2024-03-23 MED ORDER — BUPIVACAINE LIPOSOME 1.3 % IJ SUSP
INTRAMUSCULAR | Status: AC
  Filled 2024-03-23: qty 10

## 2024-03-23 MED ORDER — DROPERIDOL 2.5 MG/ML IJ SOLN
0.6250 mg | Freq: Once | INTRAMUSCULAR | Status: AC | PRN
  Administered 2024-03-23: 0.625 mg via INTRAVENOUS

## 2024-03-23 MED ORDER — LABETALOL HCL 5 MG/ML IV SOLN: 10.0000 mg | INTRAVENOUS | Status: DC | PRN

## 2024-03-23 MED ORDER — HYDROMORPHONE HCL 1 MG/ML IJ SOLN
INTRAMUSCULAR | Status: DC | PRN
  Administered 2024-03-23: 1 mg via INTRAVENOUS

## 2024-03-23 MED ORDER — ROCURONIUM BROMIDE 100 MG/10ML IV SOLN
INTRAVENOUS | Status: DC | PRN
  Administered 2024-03-23: 50 mg via INTRAVENOUS
  Administered 2024-03-23: 20 mg via INTRAVENOUS

## 2024-03-23 MED ORDER — ACETAMINOPHEN 500 MG PO TABS
ORAL_TABLET | ORAL | Status: AC
  Filled 2024-03-23: qty 2

## 2024-03-23 MED ORDER — GLYCOPYRROLATE 0.2 MG/ML IJ SOLN
INTRAMUSCULAR | Status: DC | PRN
  Administered 2024-03-23: .2 mg via INTRAVENOUS

## 2024-03-23 MED ORDER — CHLORHEXIDINE GLUCONATE CLOTH 2 % EX PADS: 6.0000 | MEDICATED_PAD | Freq: Once | CUTANEOUS | Status: DC

## 2024-03-23 MED ORDER — ISOSULFAN BLUE 1 % ~~LOC~~ SOLN
SUBCUTANEOUS | Status: DC | PRN
  Administered 2024-03-23: 5 mg via SUBCUTANEOUS

## 2024-03-23 MED ORDER — CARBOXYMETHYLCELLULOSE SODIUM 1 % OP SOLN: 1.0000 [drp] | Freq: Three times a day (TID) | OPHTHALMIC | Status: DC

## 2024-03-23 MED ORDER — OXYCODONE HCL 5 MG PO TABS: 10.0000 mg | ORAL_TABLET | ORAL | Status: DC | PRN

## 2024-03-23 MED ORDER — LIDOCAINE HCL (CARDIAC) PF 100 MG/5ML IV SOSY
PREFILLED_SYRINGE | INTRAVENOUS | Status: DC | PRN
  Administered 2024-03-23: 80 mg via INTRAVENOUS

## 2024-03-23 MED ORDER — PROPOFOL 10 MG/ML IV BOLUS
INTRAVENOUS | Status: DC | PRN
  Administered 2024-03-23: 100 mg via INTRAVENOUS

## 2024-03-23 MED ORDER — SCOPOLAMINE 1 MG/3DAYS TD PT72
1.0000 | MEDICATED_PATCH | Freq: Once | TRANSDERMAL | Status: AC
  Administered 2024-03-23: 1.5 mg via TRANSDERMAL

## 2024-03-23 MED ORDER — ALLOPURINOL 100 MG PO TABS
100.0000 mg | ORAL_TABLET | Freq: Every day | ORAL | Status: DC
  Administered 2024-03-24: 100 mg via ORAL
  Filled 2024-03-23: qty 1

## 2024-03-23 MED ORDER — CHLORHEXIDINE GLUCONATE 0.12 % MT SOLN
OROMUCOSAL | Status: AC
  Filled 2024-03-23: qty 15

## 2024-03-23 MED ORDER — LATANOPROST 0.005 % OP SOLN
1.0000 [drp] | Freq: Every day | OPHTHALMIC | Status: DC
  Administered 2024-03-23: 1 [drp] via OPHTHALMIC
  Filled 2024-03-23: qty 2.5

## 2024-03-23 MED ORDER — DROPERIDOL 2.5 MG/ML IJ SOLN
INTRAMUSCULAR | Status: AC
  Filled 2024-03-23: qty 2

## 2024-03-23 MED ORDER — CEFAZOLIN SODIUM-DEXTROSE 2-4 GM/100ML-% IV SOLN
INTRAVENOUS | Status: AC
  Filled 2024-03-23: qty 100

## 2024-03-23 MED ORDER — OXYCODONE HCL 5 MG PO TABS: 5.0000 mg | ORAL_TABLET | Freq: Once | ORAL | Status: DC | PRN

## 2024-03-23 MED ORDER — ROSUVASTATIN CALCIUM 10 MG PO TABS
10.0000 mg | ORAL_TABLET | Freq: Every day | ORAL | Status: DC
  Administered 2024-03-23: 10 mg via ORAL
  Filled 2024-03-23: qty 1

## 2024-03-23 MED ORDER — ACETAMINOPHEN 500 MG PO TABS
1000.0000 mg | ORAL_TABLET | Freq: Once | ORAL | Status: AC
  Administered 2024-03-23: 1000 mg via ORAL

## 2024-03-23 MED ORDER — HEMOSTATIC AGENTS (NO CHARGE) OPTIME
TOPICAL | Status: DC | PRN
  Administered 2024-03-23: 1 via TOPICAL

## 2024-03-23 MED ORDER — BUPIVACAINE-EPINEPHRINE (PF) 0.5% -1:200000 IJ SOLN
INTRAMUSCULAR | Status: AC
  Filled 2024-03-23: qty 10

## 2024-03-23 MED ORDER — DEXAMETHASONE SODIUM PHOSPHATE 10 MG/ML IJ SOLN
INTRAMUSCULAR | Status: DC | PRN
  Administered 2024-03-23: 10 mg via INTRAVENOUS

## 2024-03-23 MED ORDER — SUGAMMADEX SODIUM 200 MG/2ML IV SOLN
INTRAVENOUS | Status: DC | PRN
  Administered 2024-03-23: 200 mg via INTRAVENOUS

## 2024-03-23 MED ORDER — ISOSULFAN BLUE 1 % ~~LOC~~ SOLN
SUBCUTANEOUS | Status: AC
  Filled 2024-03-23: qty 5

## 2024-03-23 MED ORDER — EPHEDRINE SULFATE-NACL 50-0.9 MG/10ML-% IV SOSY
PREFILLED_SYRINGE | INTRAVENOUS | Status: DC | PRN
Start: 2024-03-23 — End: 2024-03-23
  Administered 2024-03-23 (×2): 10 mg via INTRAVENOUS

## 2024-03-23 MED ORDER — MIDAZOLAM HCL 2 MG/2ML IJ SOLN
INTRAMUSCULAR | Status: AC
  Filled 2024-03-23: qty 2

## 2024-03-23 MED ORDER — CHLORHEXIDINE GLUCONATE 0.12 % MT SOLN
15.0000 mL | Freq: Once | OROMUCOSAL | Status: AC
  Administered 2024-03-23: 15 mL via OROMUCOSAL

## 2024-03-23 MED ORDER — ACETAMINOPHEN 500 MG PO TABS
1000.0000 mg | ORAL_TABLET | Freq: Three times a day (TID) | ORAL | Status: DC
  Administered 2024-03-23 – 2024-03-24 (×3): 1000 mg via ORAL
  Filled 2024-03-23 (×3): qty 2

## 2024-03-23 MED ORDER — OXYCODONE HCL 5 MG PO TABS: 5.0000 mg | ORAL_TABLET | ORAL | Status: DC | PRN

## 2024-03-23 MED ORDER — SCOPOLAMINE 1 MG/3DAYS TD PT72
MEDICATED_PATCH | TRANSDERMAL | Status: AC
  Filled 2024-03-23: qty 1

## 2024-03-23 MED ORDER — OXYCODONE HCL 5 MG/5ML PO SOLN: 5.0000 mg | Freq: Once | ORAL | Status: DC | PRN

## 2024-03-23 MED ORDER — LACTATED RINGERS IV SOLN: INTRAVENOUS | Status: DC

## 2024-03-23 MED ORDER — TECHNETIUM TC 99M TILMANOCEPT KIT
1.0000 | PACK | Freq: Once | INTRAVENOUS | Status: AC | PRN
  Administered 2024-03-23: 1 via INTRADERMAL

## 2024-03-23 MED ORDER — PANTOPRAZOLE SODIUM 40 MG PO TBEC
40.0000 mg | DELAYED_RELEASE_TABLET | Freq: Every day | ORAL | Status: DC
  Administered 2024-03-24: 40 mg via ORAL
  Filled 2024-03-23: qty 1

## 2024-03-23 SURGICAL SUPPLY — 36 items
BRA SURGICAL LRG (MISCELLANEOUS) IMPLANT
CHLORAPREP W/TINT 26 (MISCELLANEOUS) ×1 IMPLANT
CLIP APPLIE 11 MED OPEN (CLIP) IMPLANT
COVER PROBE GAMMA FINDER SLV (MISCELLANEOUS) ×1 IMPLANT
DERMABOND ADVANCED .7 DNX12 (GAUZE/BANDAGES/DRESSINGS) ×2 IMPLANT
DRAIN CHANNEL JP 10F RND 20C F (MISCELLANEOUS) IMPLANT
DRAIN CHANNEL JP 15F RND 3/16 (MISCELLANEOUS) IMPLANT
DRAPE LAPAROTOMY TRNSV 106X77 (MISCELLANEOUS) ×1 IMPLANT
DRSG GAUZE FLUFF 36X18 (GAUZE/BANDAGES/DRESSINGS) ×1 IMPLANT
ELECTRODE REM PT RTRN 9FT ADLT (ELECTROSURGICAL) ×1 IMPLANT
EVACUATOR SILICONE 100CC (DRAIN) IMPLANT
GAUZE 4X4 16PLY ~~LOC~~+RFID DBL (SPONGE) IMPLANT
GLOVE BIOGEL PI IND STRL 7.5 (GLOVE) ×1 IMPLANT
GLOVE SURG SYN 7.0 (GLOVE) ×3 IMPLANT
GLOVE SURG SYN 7.0 PF PI (GLOVE) ×1 IMPLANT
GOWN STRL REUS W/ TWL LRG LVL3 (GOWN DISPOSABLE) ×2 IMPLANT
KIT TURNOVER KIT A (KITS) ×1 IMPLANT
LABEL OR SOLS (LABEL) ×1 IMPLANT
MANIFOLD NEPTUNE II (INSTRUMENTS) ×1 IMPLANT
NDL FILTER BLUNT 18X1 1/2 (NEEDLE) ×1 IMPLANT
NDL HYPO 22X1.5 SAFETY MO (MISCELLANEOUS) ×2 IMPLANT
NDL SAFETY ECLIPSE 18X1.5 (NEEDLE) ×1 IMPLANT
NEEDLE FILTER BLUNT 18X1 1/2 (NEEDLE) ×1 IMPLANT
NEEDLE HYPO 22X1.5 SAFETY MO (MISCELLANEOUS) ×2 IMPLANT
PACK BASIN MAJOR ARMC (MISCELLANEOUS) ×1 IMPLANT
POWDER SURGICEL 3.0 GRAM (HEMOSTASIS) IMPLANT
SPONGE DRAIN TRACH 4X4 STRL 2S (GAUZE/BANDAGES/DRESSINGS) IMPLANT
SPONGE T-LAP 18X18 ~~LOC~~+RFID (SPONGE) IMPLANT
SUT ETH BLK MONO 3 0 FS 1 12/B (SUTURE) IMPLANT
SUT SILK 2 0 SH (SUTURE) IMPLANT
SUT VICRYL 3-0 CR8 SH (SUTURE) ×1 IMPLANT
SUTURE EHLN 3-0 FS-10 30 BLK (SUTURE) IMPLANT
SUTURE MNCRL 4-0 27XMF (SUTURE) ×1 IMPLANT
SYR 10ML LL (SYRINGE) ×1 IMPLANT
TRAP FLUID SMOKE EVACUATOR (MISCELLANEOUS) ×1 IMPLANT
WATER STERILE IRR 500ML POUR (IV SOLUTION) ×1 IMPLANT

## 2024-03-23 NOTE — Brief Op Note (Signed)
 03/23/2024  12:24 PM  PATIENT:  Angela Greer  73 y.o. female  PRE-OPERATIVE DIAGNOSIS:  right breast cancer  POST-OPERATIVE DIAGNOSIS:  right breast cancer  PROCEDURE:  Procedure(s): MASTECTOMY WITH SENTINEL LYMPH NODE BIOPSY (Right)  SURGEON:  Surgeons and Role:    * Barrett Lick, MD - Primary  PHYSICIAN ASSISTANT:   ASSISTANTS: Gabriela Johns, PA-C   ANESTHESIA:   general  EBL:  50 mL   BLOOD ADMINISTERED:none  DRAINS: (2) Jackson-Pratt drain(s) with closed bulb suction in the axilla and across pec muscle   LOCAL MEDICATIONS USED:  MARCAINE     and BUPIVICAINE   SPECIMEN:  Mastectomy with SLN  DISPOSITION OF SPECIMEN:  PATHOLOGY  COUNTS:  YES  TOURNIQUET:  * No tourniquets in log *  DICTATION: .Dragon Dictation  PLAN OF CARE: Admit for overnight observation  PATIENT DISPOSITION:  PACU - hemodynamically stable.   Delay start of Pharmacological VTE agent (>24hrs) due to surgical blood loss or risk of bleeding: no

## 2024-03-23 NOTE — Plan of Care (Signed)
  Problem: Health Behavior/Discharge Planning: Goal: Ability to manage health-related needs will improve Outcome: Progressing   Problem: Clinical Measurements: Goal: Ability to maintain clinical measurements within normal limits will improve Outcome: Progressing Goal: Will remain free from infection Outcome: Progressing Goal: Diagnostic test results will improve Outcome: Progressing Goal: Respiratory complications will improve Outcome: Progressing Goal: Cardiovascular complication will be avoided Outcome: Progressing   Problem: Activity: Goal: Risk for activity intolerance will decrease Outcome: Progressing   Problem: Nutrition: Goal: Adequate nutrition will be maintained Outcome: Progressing   Problem: Coping: Goal: Level of anxiety will decrease Outcome: Progressing   Problem: Elimination: Goal: Will not experience complications related to bowel motility Outcome: Progressing Goal: Will not experience complications related to urinary retention Outcome: Progressing   Problem: Pain Managment: Goal: General experience of comfort will improve and/or be controlled Outcome: Progressing   Problem: Safety: Goal: Ability to remain free from injury will improve Outcome: Progressing

## 2024-03-23 NOTE — H&P (Signed)
 No changes to below H and P, proceed with RIGHT mastectomy with sentinal lymph node biposy.    Patient ID: Angela Greer, female   DOB: 1951/09/10, 73 y.o.   MRN: [id number] CC: Right Breast Cancer History of Present Illness Angela Greer is a 73 y.o. female with past medical history as below who presents in consultation for right breast cancer.  The patient reports that she was in normal health when she had a right mammogram performed.  This was a screening mammogram and actually done bilaterally.  Her mammogram was concerning for architectural distortion in the right breast so she underwent diagnostic mammogram that showed 2 areas of concern.  She subsequently underwent stereotactic guided biopsy of these areas of concern.  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  She has been seen by oncology and presents today for discussion about treatment for her breast cancer.  She says that she did not notice any breast changes including no overlying skin changes or nipple retraction.  She has had no nipple discharge.  She has not had any pain in her right breast nor has she felt any dominant masses.  She also denies it feeling any dominant masses in her axilla.  Her biopsy results were consistent with an ER/PR positive tumor.  She has a history of using birth control.  She has undergone menopause.  She has a family history of breast cancer to include 2 sisters that were diagnosed with breast cancer.  She had her first menses at age 87.  She has never had any children..   Past Medical History     Past Medical History:  Diagnosis Date   Acid reflux     Diverticulitis     Gout     Hypertension                   Past Surgical History:  Procedure Laterality Date   BREAST BIOPSY Right 02/24/2024    Stereo bx, x-clip,path pending   BREAST BIOPSY Right 02/24/2024    Stereo bx, Coil Clip, path pending   BREAST BIOPSY Right 02/24/2024    MM RT BREAST BX W  LOC DEV 1ST LESION IMAGE BX SPEC STEREO GUIDE 02/24/2024 [facility]   BREAST BIOPSY Right 02/24/2024    MM RT BREAST BX W LOC DEV EA AD LESION IMG BX SPEC STEREO GUIDE 02/24/2024 [facility]   HERNIA REPAIR Right      inguinal   TOTAL HIP ARTHROPLASTY Bilateral            Allergies       Allergies  Allergen Reactions   Pravastatin        REACTION: constipation   Pravastatin Sodium        REACTION: constipation   Simvastatin        REACTION: along with other statins causes myalgias.  CPK remains normal.              Current Outpatient Medications  Medication Sig Dispense Refill   acetaminophen  (TYLENOL ) 500 MG tablet Take by mouth.       allopurinol  (ZYLOPRIM ) 100 MG tablet Take 1 tablet by mouth daily.       Calcium -Magnesium-Vitamin D 600-40-500 MG-MG-UNIT TB24 2 tablets       Chlorpheniramine Maleate (ALLERGY RELIEF PO) 1 tablet       docusate sodium  (STOOL SOFTENER) 100 MG capsule 1 capsule as needed       hydrochlorothiazide (HYDRODIURIL) 25 MG  tablet 1 tablet in the morning       latanoprost  (XALATAN ) 0.005 % ophthalmic solution 1 drop at bedtime.       lidocaine -prilocaine  (EMLA ) cream Apply to the right areola and cover with plastic wrap one hour prior to leaving for surgery. 5 g 0   losartan (COZAAR) 25 MG tablet TAKE 1 TABLET BY MOUTH EVERY DAY FOR 30 DAYS       methocarbamol  (ROBAXIN ) 500 MG tablet Take 1 tablet (500 mg total) by mouth 2 (two) times daily as needed for muscle spasms. 10 tablet 0   montelukast  (SINGULAIR ) 10 MG tablet Take 10 mg by mouth daily.       Omega-3 Fatty Acids (FISH OIL) 1000 MG CAPS 1 capsule.       OVER THE COUNTER MEDICATION Unable to remember names of medicines and medicines are not with her.   Blood pressure medicine Gout medicine Acid reflux       pantoprazole  (PROTONIX ) 40 MG tablet 1 tablet Orally Once a day for 100 days for acid reflux       rosuvastatin  (CRESTOR ) 10 MG tablet Take 10 mg by mouth at bedtime.           No current facility-administered medications for this visit.        Family History      Family History  Problem Relation Age of Onset   Breast cancer Sister     Breast cancer Sister     Breast cancer Paternal Aunt     Breast cancer Paternal Aunt     Breast cancer Paternal Aunt              Social History Social History  Social History         Tobacco Use   Smoking status: Former      Passive exposure: Past   Smokeless tobacco: Never  Vaping Use   Vaping status: Never Used  Substance Use Topics   Alcohol  use: No   Drug use: No            ROS Full ROS of systems performed and is otherwise negative there than what is stated in the HPI   Physical Exam Blood pressure 139/77, pulse 77, height 5' 3.5" (1.613 m), weight 180 lb (81.6 kg), SpO2 100%.   Alert and oriented x 3, normal work of breathing on room air, abdomen is soft, nontender nondistended, regular rate and rhythm, mood and affect appropriate.  I performed a breast exam in the presence of a chaperone.  On her left breast there is no overlying skin changes or nipple retraction.  There is no nipple discharge.  She has no left axillary lymphadenopathy.  On her right breast there is some bruising at the areas of biopsy.  This is in the upper outer quadrant and the outer lower quadrant where the biopsy insertion needles were.  This is tender to palpation over where the bruises are.  She has no dominant masses.  There are no dimpling of the skin or nipple discharge.  She has no right axillary lymphadenopathy.   Data Reviewed I have reviewed her mammograms.  There are 2 areas that were biopsied.  Clips were placed in this.  The pathology I have also reviewed which is consistent with invasive ductal carcinoma as well as ductal carcinoma in situ.  I have also reviewed her oncologist note and the fact that she may benefit from radiation or chemotherapy in the adjuvant setting.  Given her  hormonal status she will also benefit  for hormone therapy.   I have personally reviewed the patient's imaging and medical records.     Assessment/Plan Assessment The patient is a 73 year old with a clinical T3 clinical N0 invasive ductal carcinoma as well as ductal carcinoma in situ from biopsy who presents today in consultation for surgical management of her breast cancer.  I briefly explained her clinical course to her and the treatment options for breast cancer.  I discussed with her that given she has 2 foci of abnormalities it may be difficult to perform a lumpectomy.  She has chosen to proceed with a total mastectomy.  I discussed with her the surgical management of breast cancer to include total mastectomy and sentinel lymph node biopsy.  She may benefit from XRT as well as chemotherapy but that will depend on the nodal disease burden and the pathological features of her tumor.  I also discussed with her that if there is a large disease burden in her axilla then we may need to perform a complete axillary lymphadenectomy.  I also discussed with her that I would be happy to refer her to plastic surgery for reconstructive options.  At this time she is elected not to proceed with any reconstruction.  We discussed the role of bilateral mastectomies for her but she has opted to just do the unilateral right breast.  I discussed the risk, benefits and alternatives of the procedure including risk of infection, bleeding, positive margins, pain and poor cosmetic outcomes.  She understands all these risks and wishes to proceed   A total of 60 minutes was spent discussing the patient's history and physical, performing a physical exam and coming in agreement to treatment options for the patient.  We will plan to proceed with surgery on May 19.  She will need to have the nuclear tracer injected by radiology prior to the procedure.         Barrett Lick 03/03/2024, 1:41 PM

## 2024-03-23 NOTE — Anesthesia Postprocedure Evaluation (Signed)
 Anesthesia Post Note  Patient: Angela Greer  Procedure(s) Performed: MASTECTOMY WITH SENTINEL LYMPH NODE BIOPSY (Right: Breast)  Patient location during evaluation: PACU Anesthesia Type: General Level of consciousness: awake and alert Pain management: pain level controlled Vital Signs Assessment: post-procedure vital signs reviewed and stable Respiratory status: spontaneous breathing, nonlabored ventilation and respiratory function stable Cardiovascular status: blood pressure returned to baseline and stable Postop Assessment: no apparent nausea or vomiting Anesthetic complications: no   No notable events documented.   Last Vitals:  Vitals:   03/23/24 1300 03/23/24 1315  BP: 133/68 (!) 128/59  Pulse: 71 78  Resp: 11 11  Temp:    SpO2: 97% 97%    Last Pain:  Vitals:   03/23/24 1315  TempSrc:   PainSc: Asleep                 Baltazar Bonier

## 2024-03-23 NOTE — Transfer of Care (Signed)
 Immediate Anesthesia Transfer of Care Note  Patient: Angela Greer  Procedure(s) Performed: MASTECTOMY WITH SENTINEL LYMPH NODE BIOPSY (Right: Breast)  Patient Location: PACU  Anesthesia Type:General  Level of Consciousness: awake, drowsy, and patient cooperative  Airway & Oxygen Therapy: Patient Spontanous Breathing  Post-op Assessment: Report given to RN and Post -op Vital signs reviewed and stable  Post vital signs: Reviewed and stable  Last Vitals:  Vitals Value Taken Time  BP 158/69 03/23/24 1208  Temp 36.3 C 03/23/24 1208  Pulse 69 03/23/24 1213  Resp 13 03/23/24 1213  SpO2 89 % 03/23/24 1213  Vitals shown include unfiled device data.  Last Pain:  Vitals:   03/23/24 0912  TempSrc:   PainSc: 0-No pain         Complications: No notable events documented.

## 2024-03-23 NOTE — Anesthesia Preprocedure Evaluation (Addendum)
 Anesthesia Evaluation  Patient identified by MRN, date of birth, ID band Patient awake    Reviewed: Allergy & Precautions, H&P , NPO status , Patient's Chart, lab work & pertinent test results  Airway Mallampati: II  TM Distance: >3 FB Neck ROM: full    Dental no notable dental hx.    Pulmonary former smoker   Pulmonary exam normal        Cardiovascular hypertension, Normal cardiovascular exam     Neuro/Psych negative neurological ROS  negative psych ROS   GI/Hepatic Neg liver ROS, hiatal hernia,GERD  ,,  Endo/Other  negative endocrine ROS    Renal/GU      Musculoskeletal  (+) Arthritis ,    Abdominal  (+) + obese  Peds  Hematology negative hematology ROS (+)   Anesthesia Other Findings Past Medical History: No date: Acid reflux 05/17/2007: Avascular necrosis of femoral head (HCC) No date: Diverticulitis 05/17/2007: Dupuytren's contracture of right hand No date: Gout No date: History of hiatal hernia 05/17/2007: Hyperlipidemia, mixed No date: Hypertension 02/28/2024: Invasive ductal carcinoma of right breast (HCC) No date: PONV (postoperative nausea and vomiting)  Past Surgical History: 02/24/2024: BREAST BIOPSY; Right     Comment:  Stereo bx, x-clip,path pending 02/24/2024: BREAST BIOPSY; Right     Comment:  Stereo bx, Coil Clip, path pending 02/24/2024: BREAST BIOPSY; Right     Comment:  MM RT BREAST BX W LOC DEV 1ST LESION IMAGE BX SPEC               STEREO GUIDE 02/24/2024 [facility] 02/24/2024: BREAST BIOPSY; Right     Comment:  MM RT BREAST BX W LOC DEV EA AD LESION IMG BX SPEC               STEREO GUIDE 02/24/2024 [facility] No date: HERNIA REPAIR; Right     Comment:  inguinal No date: TOTAL HIP ARTHROPLASTY; Bilateral     Reproductive/Obstetrics negative OB ROS                              Anesthesia Physical Anesthesia Plan  ASA: 2  Anesthesia  Plan: General ETT   Post-op Pain Management: Tylenol  PO (pre-op)*, Toradol  IV (intra-op)* and Gabapentin  PO (pre-op)*   Induction: Intravenous  PONV Risk Score and Plan: 2 and Ondansetron , Dexamethasone  and Midazolam   Airway Management Planned: Oral ETT  Additional Equipment:   Intra-op Plan:   Post-operative Plan: Extubation in OR  Informed Consent: I have reviewed the patients History and Physical, chart, labs and discussed the procedure including the risks, benefits and alternatives for the proposed anesthesia with the patient or authorized representative who has indicated his/her understanding and acceptance.     Dental Advisory Given  Plan Discussed with: CRNA and Surgeon  Anesthesia Plan Comments:          Anesthesia Quick Evaluation

## 2024-03-23 NOTE — Anesthesia Procedure Notes (Signed)
 Procedure Name: Intubation Date/Time: 03/23/2024 9:29 AM  Performed by: Niki Barter, CRNAPre-anesthesia Checklist: Patient identified, Emergency Drugs available, Suction available and Patient being monitored Patient Re-evaluated:Patient Re-evaluated prior to induction Oxygen Delivery Method: Circle system utilized Preoxygenation: Pre-oxygenation with 100% oxygen Induction Type: IV induction Ventilation: Mask ventilation without difficulty Laryngoscope Size: McGrath and 3 Grade View: Grade I Tube type: Oral Tube size: 6.5 mm Number of attempts: 1 Airway Equipment and Method: Stylet Placement Confirmation: ETT inserted through vocal cords under direct vision, positive ETCO2 and breath sounds checked- equal and bilateral Secured at: 21 cm Tube secured with: Tape Dental Injury: Teeth and Oropharynx as per pre-operative assessment

## 2024-03-24 ENCOUNTER — Other Ambulatory Visit: Payer: Self-pay

## 2024-03-24 ENCOUNTER — Encounter: Payer: Self-pay | Admitting: General Surgery

## 2024-03-24 DIAGNOSIS — C50911 Malignant neoplasm of unspecified site of right female breast: Secondary | ICD-10-CM | POA: Diagnosis not present

## 2024-03-24 MED ORDER — OXYCODONE HCL 5 MG PO TABS
5.0000 mg | ORAL_TABLET | Freq: Three times a day (TID) | ORAL | 0 refills | Status: DC | PRN
  Filled 2024-03-24: qty 20, 7d supply, fill #0

## 2024-03-24 MED ORDER — ONDANSETRON 4 MG PO TBDP: 4.0000 mg | ORAL_TABLET | Freq: Three times a day (TID) | ORAL | 0 refills | Status: DC | PRN

## 2024-03-24 MED ORDER — ONDANSETRON 4 MG PO TBDP
4.0000 mg | ORAL_TABLET | Freq: Three times a day (TID) | ORAL | 0 refills | Status: AC | PRN
  Filled 2024-03-24: qty 20, 7d supply, fill #0

## 2024-03-24 MED ORDER — OXYCODONE HCL 5 MG PO TABS: 5.0000 mg | ORAL_TABLET | Freq: Three times a day (TID) | ORAL | 0 refills | Status: DC | PRN

## 2024-03-24 MED ORDER — ALUM & MAG HYDROXIDE-SIMETH 200-200-20 MG/5ML PO SUSP
30.0000 mL | Freq: Four times a day (QID) | ORAL | Status: DC | PRN
  Administered 2024-03-24: 30 mL via ORAL
  Filled 2024-03-24: qty 30

## 2024-03-24 NOTE — Progress Notes (Signed)
 Patient discharged to home accompanied by niece with all belongings. Medications and discharge instructions reviewed with patient and caregiver. All questions answered. PIV x 2 removed, no bleeding, intact.  Patient verbalized understanding of signs and symptoms of infection.  Patient agreed to follow up with appointments as listed on AVS. Patient satisfied with overall care at [facility].

## 2024-03-24 NOTE — Plan of Care (Signed)

## 2024-03-24 NOTE — TOC CM/SW Note (Signed)
 Transition of Care [facility]) - Inpatient Brief Assessment   Patient Details  Name: Angela Greer MRN: [id number] Date of Birth: 17-Mar-1951  Transition of Care [facility]) CM/SW Contact:    Loman Risk, RN Phone Number: 03/24/2024, 9:33 AM   Clinical Narrative:   Transition of Care [facility]) Screening Note   Patient Details  Name: Angela Greer Date of Birth: 1951/06/01   Transition of Care [facility]) CM/SW Contact:    Loman Risk, RN Phone Number: 03/24/2024, 9:33 AM    Transition of Care Department [facility]) has reviewed patient and no TOC needs have been identified at this time.  If new patient transition needs arise, please place a TOC consult.    Transition of Care Asessment: Insurance and Status: Insurance coverage has been reviewed Patient has primary care physician: Yes     Prior/Current Home Services: No current home services Social Drivers of Health Review: SDOH reviewed no interventions necessary Readmission risk has been reviewed: Yes Transition of care needs: no transition of care needs at this time

## 2024-03-24 NOTE — Discharge Summary (Signed)
 Patient ID: Angela Greer MRN: [id number] DOB/AGE: November 25, 1950 73 y.o.  Admit date: 03/23/2024 Discharge date: 03/24/2024   Discharge Diagnoses:  Principal Problem:   History of right breast cancer   Procedures:Right Mastectomy with [facility]  Hospital Course:   admitted with findings consistent with right breast cacner and  was taken promptly to the operating room for an uneventful mastectomy with SLNB.  Patient was kept overnight.  The time of discharge the patient was ambulating,  pain was controlled.  Her vital signs were stable and she was afebrile.   physical exam at discharge showed a pt  in no acute distress.  Awake and alert.  Abdomen: Soft incisions healing well without infection or peritonitis.  Extremities well-perfused and no edema.  Condition of the patient the time of discharge was stable She has surgical bra on, drains dark, old blood in drains, 70ccs out overnight  Consults: NOne  Disposition: Discharge disposition: 01-Home or Self Care       Discharge Instructions     Call MD for:  difficulty breathing, headache or visual disturbances   Complete by: As directed    Call MD for:  extreme fatigue   Complete by: As directed    Call MD for:  hives   Complete by: As directed    Call MD for:  persistant dizziness or light-headedness   Complete by: As directed    Call MD for:  persistant nausea and vomiting   Complete by: As directed    Call MD for:  redness, tenderness, or signs of infection (pain, swelling, redness, odor or green/yellow discharge around incision site)   Complete by: As directed    Call MD for:  severe uncontrolled pain   Complete by: As directed    Call MD for:  temperature >100.4   Complete by: As directed    Diet - low sodium heart healthy   Complete by: As directed    Discharge instructions   Complete by: As directed    Please follow up on Thursday at 3:45 PM. Please leave surgical bra in place till this time. Please strip drains twice  per day and record output daily. You may take tylenol  and ibuprofen for pain. You may bird bathe until Thursday.   Increase activity slowly   Complete by: As directed       Allergies as of 03/24/2024       Reactions   Pravastatin    REACTION: constipation   Pravastatin Sodium    REACTION: constipation   Simvastatin    REACTION: along with other statins causes myalgias.  CPK remains normal.        Medication List     TAKE these medications    acetaminophen  500 MG tablet Commonly known as: TYLENOL  Take 1,000 mg by mouth every 6 (six) hours as needed for moderate pain (pain score 4-6) or mild pain (pain score 1-3).   allopurinol  100 MG tablet Commonly known as: ZYLOPRIM  Take 100 mg by mouth daily.   Calcium -Magnesium-Vitamin D 600-40-500 MG-MG-UNIT Tb24 Take 1 tablet by mouth 2 (two) times daily.   carboxymethylcellulose 1 % ophthalmic solution Place 1 drop into both eyes 3 (three) times daily. Refresh   Fish Oil 1200 MG Caps Take 1,200 mg by mouth 2 (two) times daily.   hydrochlorothiazide 25 MG tablet Commonly known as: HYDRODIURIL Take 25 mg by mouth daily.   latanoprost  0.005 % ophthalmic solution Commonly known as: XALATAN  Place 1 drop into both eyes at bedtime.  lidocaine -prilocaine  cream Commonly known as: EMLA  Apply to the right areola and cover with plastic wrap one hour prior to leaving for surgery.   losartan 25 MG tablet Commonly known as: COZAAR TAKE 1 TABLET BY MOUTH EVERY DAY FOR 30 DAYS   montelukast  10 MG tablet Commonly known as: SINGULAIR  Take 10 mg by mouth daily.   ondansetron  4 MG disintegrating tablet Commonly known as: ZOFRAN -ODT Take 1 tablet (4 mg total) by mouth every 8 (eight) hours as needed for nausea or vomiting.   oxyCODONE  5 MG immediate release tablet Commonly known as: Roxicodone  Take 1 tablet (5 mg total) by mouth every 8 (eight) hours as needed.   pantoprazole  40 MG tablet Commonly known as: PROTONIX  Take 40 mg  by mouth daily.   potassium chloride  SA 20 MEQ tablet Commonly known as: KLOR-CON  M Take 2 tablets (40 mEq) today, then 1 tablet (20 mEq) daily until surgery. Be sure to take dose on day of surgery. Follow up with PCP for repeat labs.   rosuvastatin  10 MG tablet Commonly known as: CRESTOR  Take 10 mg by mouth at bedtime.   Stool Softener 100 MG capsule Generic drug: docusate sodium  Take 100 mg by mouth daily as needed for mild constipation or moderate constipation.          Severa Daniels, M.D.

## 2024-03-24 NOTE — Plan of Care (Signed)
  Problem: Education: Goal: Knowledge of General Education information will improve Description: Including pain rating scale, medication(s)/side effects and non-pharmacologic comfort measures 03/24/2024 1053 by Reia Viernes N, RN Outcome: Completed/Met 03/24/2024 0814 by Drena Gentile, RN Outcome: Progressing   Problem: Health Behavior/Discharge Planning: Goal: Ability to manage health-related needs will improve 03/24/2024 1053 by Drena Gentile, RN Outcome: Completed/Met 03/24/2024 5621 by Drena Gentile, RN Outcome: Progressing   Problem: Clinical Measurements: Goal: Ability to maintain clinical measurements within normal limits will improve 03/24/2024 1053 by Drena Gentile, RN Outcome: Completed/Met 03/24/2024 3086 by Drena Gentile, RN Outcome: Progressing Goal: Will remain free from infection 03/24/2024 1053 by Drena Gentile, RN Outcome: Completed/Met 03/24/2024 5784 by Drena Gentile, RN Outcome: Progressing Goal: Diagnostic test results will improve 03/24/2024 1053 by Drena Gentile, RN Outcome: Completed/Met 03/24/2024 6962 by Drena Gentile, RN Outcome: Progressing Goal: Respiratory complications will improve 03/24/2024 1053 by Drena Gentile, RN Outcome: Completed/Met 03/24/2024 9528 by Drena Gentile, RN Outcome: Progressing Goal: Cardiovascular complication will be avoided 03/24/2024 1053 by Drena Gentile, RN Outcome: Completed/Met 03/24/2024 4132 by Drena Gentile, RN Outcome: Progressing   Problem: Activity: Goal: Risk for activity intolerance will decrease 03/24/2024 1053 by Drena Gentile, RN Outcome: Completed/Met 03/24/2024 4401 by Drena Gentile, RN Outcome: Progressing   Problem: Nutrition: Goal: Adequate nutrition will be maintained 03/24/2024 1053 by Drena Gentile, RN Outcome: Completed/Met 03/24/2024 0272 by Drena Gentile, RN Outcome: Progressing   Problem: Coping: Goal: Level of anxiety will decrease 03/24/2024  1053 by Drena Gentile, RN Outcome: Completed/Met 03/24/2024 5366 by Drena Gentile, RN Outcome: Progressing   Problem: Elimination: Goal: Will not experience complications related to bowel motility 03/24/2024 1053 by Drena Gentile, RN Outcome: Completed/Met 03/24/2024 4403 by Drena Gentile, RN Outcome: Progressing Goal: Will not experience complications related to urinary retention 03/24/2024 1053 by Drena Gentile, RN Outcome: Completed/Met 03/24/2024 0814 by Drena Gentile, RN Outcome: Progressing   Problem: Pain Managment: Goal: General experience of comfort will improve and/or be controlled 03/24/2024 1053 by Drena Gentile, RN Outcome: Completed/Met 03/24/2024 4742 by Drena Gentile, RN Outcome: Progressing   Problem: Safety: Goal: Ability to remain free from injury will improve 03/24/2024 1053 by Drena Gentile, RN Outcome: Completed/Met 03/24/2024 5956 by Drena Gentile, RN Outcome: Progressing   Problem: Skin Integrity: Goal: Risk for impaired skin integrity will decrease 03/24/2024 1053 by Drena Gentile, RN Outcome: Completed/Met 03/24/2024 0814 by Drena Gentile, RN Outcome: Progressing

## 2024-03-25 ENCOUNTER — Other Ambulatory Visit: Payer: Self-pay | Admitting: Pathology

## 2024-03-25 ENCOUNTER — Inpatient Hospital Stay: Attending: Oncology | Admitting: Hospice and Palliative Medicine

## 2024-03-25 LAB — SURGICAL PATHOLOGY

## 2024-03-25 NOTE — Op Note (Signed)
 Procedure Date:  03/23/2024  Pre-operative Diagnosis:  Right Breast Cancer  Post-operative Diagnosis: Same  Procedure:  Right simple mastectomy and sentinel lymph node biopsy  Surgeon:  Severa Daniels, MD  Anesthesia:  General endotracheal  Estimated Blood Loss:  50 ml  Specimens:  Right breast, Right axillary sentinel lymph node number 1 and right axillary sentinel lymph node number 2  Complications:  None  Indications for Procedure:  This is a 73 y.o. female who presents with Right invasive ductal carcinoma.  The risks of bleeding, infection, injury to surrounding structures, hematoma, seroma, open wound, cosmetic deformity, and the need for further surgery were all discussed with the patient and was willing to proceed.  Prior to this procedure, the patient had undergone sentinel lymphoscintigraphy.  Description of Procedure: The patient was correctly identified in the preoperative area and brought into the operating room.  The patient was placed supine with VTE prophylaxis in place.  Appropriate time-outs were performed.  Anesthesia was induced and the patient was intubated.  Appropriate antibiotics were infused.  A visual dye was injected in the right periareolar region under aseptic conditions, massage administered for 5 minutes. The right chest and axilla were prepped and draped in usual sterile fashion.   An elliptical incision was then copmpleted over the breast encompassing the nipple-areolar complex, with attention to the lesion of which we are concerned.  Using electrocautery, subcutaneous flaps were created superiorly to the clavicle, inferiorly to the inframammary fold, medially to the sternum, and laterally to the latissimus dorsi with careful attention to create flaps of adequate thickness.  The breast tissue was then dissected with the pectoralis fascia as the deep margin.  2-0 silk suture was used to mark the specimen as short superior and long lateral.  The cavity was then  irrigated and hemostasis was assured with electrocautery.     Then using the hand-held probe an area of high counts was identified in the axilla.  The clavopectoral fascia was openeed and the hand-held probe was used to guide dissection. A hot and blue lymph node was identified and resected.  This had a count of 250.  Additional lymph nodes were identified and resected with counts of 50s.  The wound bed had a count of 0.  The cavity was irrigated and hemostasis was assured with electrocautery.   60 ml of Exparel  solution was infiltrated into the skin and subcutaneous tissue of the cavity.  2 10 Fr Blake drains were placed via lateral stab incision to drain the mastectomy wound bed and axilla .  The incision was then closed in two layers with 3-0 Vicryl and 4-0 Monocryl and sealed with DermaBond.  The wound was dressed with fluff gauze and a surgical bra was placed.  The patient was emerged from anesthesia and extubated and brought to the recovery room for further management.  The patient tolerated the procedure well and all counts were correct at the end of the case.  Sentinel Node Biopsy Synoptic Operative Report  Operation performed with curative intent:Yes  Tracer(s) used to identify sentinel nodes in the upfront surgery (non-neoadjuvant) setting (select all that apply):Dye and Radioactive Tracer  Tracer(s) used to identify sentinel nodes in the neoadjuvant setting (select all that apply):N/A  All nodes (colored or non-colored) present at the end of a dye-filled lymphatic channel were removed:Yes   All significantly radioactive nodes were removed:Yes  All palpable suspicious nodes were removed:Yes  Biopsy-proven positive nodes marked with clips prior to chemotherapy were identified and removed:Yes  Angela Greer

## 2024-03-25 NOTE — Progress Notes (Signed)
 Multidisciplinary Oncology Council Documentation  Angela Greer was presented by our [facility] on 03/25/2024, which included representatives from:  Palliative Care Dietitian  Physical/Occupational Therapist Nurse Navigator Genetics Social work Survivorship RN Financial Navigator Research RN   Angela Greer currently presents with history of breast cancer  We reviewed previous medical and familial history, history of present illness, and recent lab results along with all available histopathologic and imaging studies. The MOC considered available treatment options and made the following recommendations/referrals:  Rehab screening  The MOC is a meeting of clinicians from various specialty areas who evaluate and discuss patients for whom a multidisciplinary approach is being considered. Final determinations in the plan of care are those of the provider(s).   Today's extended care, comprehensive team conference, Angela Greer was not present for the discussion and was not examined.

## 2024-03-26 ENCOUNTER — Ambulatory Visit (INDEPENDENT_AMBULATORY_CARE_PROVIDER_SITE_OTHER): Admitting: General Surgery

## 2024-03-26 ENCOUNTER — Encounter: Payer: Self-pay | Admitting: General Surgery

## 2024-03-26 ENCOUNTER — Encounter: Payer: Self-pay | Admitting: *Deleted

## 2024-03-26 VITALS — BP 116/74 | HR 84 | Ht 63.5 in | Wt 179.0 lb

## 2024-03-26 DIAGNOSIS — C50911 Malignant neoplasm of unspecified site of right female breast: Secondary | ICD-10-CM

## 2024-03-26 NOTE — Patient Instructions (Signed)
 Follow up here Tuesday after next. Bring your drain sheet with you.   Please call and ask to speak with a nurse if you develop questions or concerns.   Milk your drains twice a day so you don't keep any clots in the lines. This will help them flow better.    Surgical [facility] Surgical drains are placed during surgery. They are used to get rid of the extra fluid that can build up in a wound after surgery. They can also help heal the wound. The kinds of drains include: Active drains. These use suction to pull drainage away from the wound. Drainage flows through a tube to a container outside of the body. With these drains, you need to keep the bulb or the container flat (compressed) at all times, except while being emptied. Flattening the bulb or container creates suction. One of the most common types of active drains is the Jackson-Pratt (JP) drain. Passive drains. These allow fluid to drain using gravity rather than suction. Drainage flows through a tube to a bandage (dressing) or a container outside of the body. These drains do not need to be emptied. One of the most common types of passive drains is the Penrose drain. Right after surgery, drainage is often bright red and a little thicker than water. It may turn yellow or pink over time and become thinner. Your health care provider may remove the drain when the drainage stops or when the amount decreases to 1-2 tbsp (15-30 mL) in a 24-hour period. How to care for your surgical drain Care for your drain as told by your provider. Keep the skin around the drain dry and covered with a dressing at all times. This can help to prevent infection. If the drain is placed at your back, or in any other hard-to-reach area, ask someone to help you change the dressing, empty the drain, and check for infection. Changing the dressing Follow instructions from your provider about how to change your dressing. Change it at least once a day. Change it more often  if needed to keep the dressing dry. Make sure you: Gather your supplies. These may include: Tape. Germ-free cleaning solution (sterile saline). Cotton swabs. Split gauze drain sponge: 4 x 4 inches (10 x 10 cm). Gauze square: 4 x 4 inches (10 x 10 cm). Wash your hands with soap and water for at least 20 seconds before and after you change your dressing. If soap and water are not available, use hand sanitizer. Remove the old dressing. Avoid using scissors to do that. Wash your hands with soap and water again after taking off the old dressing. Use sterile saline to clean the skin around the drain. You may need to use a cotton swab to clean the skin. Place the tube through the slit in a drain sponge. Place the drain sponge so that it covers your wound. Place the gauze square or another drain sponge on top of the drain sponge that is on the wound. Make sure the tube is between those layers. Tape the dressing to your skin. Then, tape the drainage tube to your skin 1-2 inches (2.5-5 cm) below the place where the tube enters your body. Taping keeps the tube from pulling on any stitches (sutures) that you have. Wash your hands with soap and water. Write down the color of your drainage and how often you change your dressing. Emptying the active drain  Make sure you have a measuring cup that you can empty your  drainage into. Wash your hands with soap and water for at least 20 seconds before and after you empty your drain. If soap and water are not available, use hand sanitizer. Loosen any pins or clips that hold the tube in place. If your provider tells you to strip the tube to prevent clots and tube blockages: Hold the tube at the skin with one hand. Use your other hand to pinch the tube with your thumb and first finger. Gently move your fingers down the tube while squeezing very lightly. This clears any drainage, clots, or tissue from the tube. You may need to do this a few times a day to keep the tube  clear. Do not pull on the tube. Open the bulb cap or the drain plug. Do not touch the inside of the cap or the bottom of the plug. Turn the device upside down and gently squeeze. Empty all of the drainage into the measuring cup. Compress the bulb or the container. Replace the cap or the plug. To compress the bulb or the container, squeeze it firmly in the middle while you close the cap or plug the container. Write down the amount of drainage that you have in each 24-hour period. If you have less than 2 tbsp (30 mL) of drainage during the 24 hours, contact your provider. Flush the drainage down the toilet. Wash your hands with soap and water. Checking for infection Check your drain area every day for signs of infection. Check for: Redness, swelling, or pain. Warmth. Pus or a bad smell. Drainage that looks cloudy. Tenderness or pressure at the spot where the drain leaves your body. Contact a health care provider if: You have any signs of infection around your drain area. You have a fever or chills. The amount of drainage that you have stops all of a sudden, or the drainage increases rather than decreases. Your tube falls out. Your active drain does not stay compressed after you empty it. The tube gets detached from the bulb or container. This information is not intended to replace advice given to you by your health care provider. Make sure you discuss any questions you have with your health care provider. Document Revised: 06/08/2022 Document Reviewed: 06/08/2022 Elsevier Patient Education  2024 [employer].

## 2024-03-26 NOTE — Progress Notes (Signed)
 Surgical specimen did not contain any residual invasive carcinoma, biopsy specimen had 2 mm of invasive carcinoma.   Per Dr. Adrian Alba Oncotype Dx is not needed.   Notified Angela Greer of above.  She will see Dr. Adrian Alba on June 11 for follow up.

## 2024-04-01 ENCOUNTER — Other Ambulatory Visit: Payer: Self-pay | Admitting: Licensed Clinical Social Worker

## 2024-04-01 ENCOUNTER — Telehealth: Payer: Self-pay | Admitting: General Surgery

## 2024-04-01 ENCOUNTER — Encounter: Payer: Self-pay | Admitting: Licensed Clinical Social Worker

## 2024-04-01 ENCOUNTER — Inpatient Hospital Stay (HOSPITAL_BASED_OUTPATIENT_CLINIC_OR_DEPARTMENT_OTHER): Admitting: Licensed Clinical Social Worker

## 2024-04-01 ENCOUNTER — Inpatient Hospital Stay

## 2024-04-01 DIAGNOSIS — Z803 Family history of malignant neoplasm of breast: Secondary | ICD-10-CM | POA: Diagnosis not present

## 2024-04-01 DIAGNOSIS — C50911 Malignant neoplasm of unspecified site of right female breast: Secondary | ICD-10-CM

## 2024-04-01 LAB — GENETIC SCREENING ORDER

## 2024-04-01 NOTE — Progress Notes (Signed)
 REFERRING PROVIDER: Shellie Dials, MD [address] [place],  [place] [address]  PRIMARY PROVIDER:  Tawnya Fava, Waddell Guess, MD  PRIMARY REASON FOR VISIT:  1. Infiltrating ductal carcinoma of right breast (HCC)   2. Family history of breast cancer      HISTORY OF PRESENT ILLNESS:   Ms. Stepney, a 73 y.o. female, was seen for a [facility] cancer genetics consultation at the request of Dr. Adrian Alba due to a personal and family history of breast cancer.  Ms. Foody presents to clinic today to discuss the possibility of a hereditary predisposition to cancer, genetic testing, and to further clarify her future cancer risks, as well as potential cancer risks for family members.    CANCER HISTORY:  Oncology History  Invasive ductal carcinoma of right breast (HCC)  02/28/2024 Initial Diagnosis   Invasive ductal carcinoma of right breast (HCC)   02/28/2024 Cancer Staging   Staging form: Breast, AJCC 8th Edition - Clinical stage from 02/28/2024: Stage IIA (cT3, cN0, cM0, G2, ER+, PR+, HER2-) - Signed by Shellie Dials, MD on 02/28/2024 Stage prefix: Initial diagnosis Histologic grading system: 3 grade system    In 2025, at the age of 78, Ms. Rybarczyk was diagnosed with IDC of the right breast, ER/PR+, HER2-. The treatment plan has included mastectomy, completed 03/23/2024,.   RISK FACTORS:  Menarche was at age 52.  Ovaries intact: yes.  Hysterectomy: no.  Menopausal status: postmenopausal.  HRT use: 0 years. Colonoscopy: yes; normal.  Past Medical History:  Diagnosis Date   Acid reflux    Avascular necrosis of femoral head (HCC) 05/17/2007   Diverticulitis    Dupuytren's contracture of right hand 05/17/2007   Gout    History of hiatal hernia    Hyperlipidemia, mixed 05/17/2007   Hypertension    Invasive ductal carcinoma of right breast (HCC) 02/28/2024   PONV (postoperative nausea and vomiting)     Past Surgical History:  Procedure Laterality Date   BREAST BIOPSY  Right 02/24/2024   Stereo bx, x-clip,path pending   BREAST BIOPSY Right 02/24/2024   Stereo bx, Coil Clip, path pending   BREAST BIOPSY Right 02/24/2024   MM RT BREAST BX W LOC DEV 1ST LESION IMAGE BX SPEC STEREO GUIDE 02/24/2024 [facility]   BREAST BIOPSY Right 02/24/2024   MM RT BREAST BX W LOC DEV EA AD LESION IMG BX SPEC STEREO GUIDE 02/24/2024 [facility]   HERNIA REPAIR Right    inguinal   MASTECTOMY W/ SENTINEL NODE BIOPSY Right 03/23/2024   Procedure: MASTECTOMY WITH SENTINEL LYMPH NODE BIOPSY;  Surgeon: Barrett Lick, MD;  Location: [facility];  Service: General;  Laterality: Right;   TOTAL HIP ARTHROPLASTY Bilateral     FAMILY HISTORY:  We obtained a detailed, 4-generation family history.  Significant diagnoses are listed below: Family History  Problem Relation Age of Onset   Breast cancer Sister        dx 40s   Breast cancer Sister        dx 26s   Breast cancer Paternal Aunt    Breast cancer Paternal Aunt    Breast cancer Paternal Grandmother    Breast cancer Cousin    Breast cancer Cousin    Ms. Hehl does not have children. She has 2 sisters and had 2 brothers. Both of her sisters have had breast cancer in their 9s.   Ms. Saad mother is living at 51 and had a "knot" removed in her breast that she was told  would become cancer. Two maternal cousins have had breast cancer, limited information.  Ms. Sauerwein father passed at 61. Patient had two paternal aunts that had breast cancer and paternal grandmother had breast cancer.  Ms. Luera is unaware of previous family history of genetic testing for hereditary cancer risks. There is no reported Ashkenazi Jewish ancestry. There is no known consanguinity.    GENETIC COUNSELING ASSESSMENT: Ms. Jansma is a 73 y.o. female with a personal and family history of breast cancer which is somewhat suggestive of a hereditary cancer syndrome and predisposition to cancer. We, therefore, discussed and recommended the following  at today's visit.   DISCUSSION: We discussed that approximately 10% of breast cancer is hereditary. Most cases of hereditary breast cancer are associated with BRCA1/2 genes, although there are other genes associated with hereditary cancer as well. Cancers and risks are gene specific. We discussed that testing is beneficial for several reasons including knowing about cancer risks, identifying potential screening and risk-reduction options that may be appropriate, and to understand if other family members could be at risk for cancer and allow them to undergo genetic testing.   We reviewed the characteristics, features and inheritance patterns of hereditary cancer syndromes. We also discussed genetic testing, including the appropriate family members to test, the process of testing, insurance coverage and turn-around-time for results. We discussed the implications of a negative, positive and/or variant of uncertain significant result. We recommended Ms. Forti pursue genetic testing for the [facility] CancerNext+RNA gene panel.   The [facility] CancerNext+RNAinsight Panel includes sequencing, rearrangement analysis, and RNA analysis for the following 40 genes: APC, ATM, BAP1, BARD1, BMPR1A, BRCA1, BRCA2, BRIP1, CDH1, CDKN2A, CHEK2, FH, FLCN, MET, MLH1, MSH2, MSH6, MUTYH, NF1, NTHL1, PALB2, PMS2, PTEN, RAD51C, RAD51D, RPS20, SMAD4, STK11, TP53, TSC1, TSC2, and VHL (sequencing and deletion/duplication); AXIN2, HOXB13, MBD4, MSH3, POLD1 and POLE (sequencing only); EPCAM and GREM1 (deletion/duplication only).  Based on Ms. Ayre's personal and family history of cancer, she meets medical criteria for genetic testing. Despite that she meets criteria, she may still have an out of pocket cost.   PLAN: After considering the risks, benefits, and limitations, Ms. Rehberg provided informed consent to pursue genetic testing and the blood sample was sent to [facility] for analysis of the CancerNext+RNA panel. Results should be  available within approximately 2-3 weeks' time, at which point they will be disclosed by telephone to Ms. Cassis, as will any additional recommendations warranted by these results. Ms. Eastmond will receive a summary of her genetic counseling visit and a copy of her results once available. This information will also be available in Epic.   Ms. Dutan questions were answered to her satisfaction today. Our contact information was provided should additional questions or concerns arise. Thank you for the referral and allowing us  to share in the care of your patient.   Valri Gee, MS, [facility] Genetic Counselor [email or web address] Phone: [phone]  45 minutes were spent on the date of the encounter in service to the patient including preparation, face-to-face consultation, documentation and care coordination. Dr. Nelson Bandy was available for discussion regarding this case.   _______________________________________________________________________ For Office Staff:  Number of people involved in session: 1 Was an Intern/ student involved with case: no

## 2024-04-01 NOTE — Telephone Encounter (Signed)
 Patient calls, had right breast mastectomy done 03/21/24 Dr. Cornel Diesel.  Patient states that her niece changed her dressing and noticed that the area is warm to touch. There is no redness or swelling, just concerned of the warmth.  Please call patient.

## 2024-04-06 NOTE — Progress Notes (Signed)
 Patient returns today status post right mastectomy without reconstruction.  We also completed a sentinel lymph node biopsy.  She reports doing well.  She denies any uncontrolled pain.  She has been taking care of her drains and says that they have not been putting out very much.  She is tolerating a diet bowel function.  On exam there is some bruising at the inferior flap.  The surgical glue is in place without any drainage from the wound.  Her surgical drains were inspected.  There is some sanguinous material in the drain that extends over the pectoralis muscle.  This drain was stripped and I was able to evacuate approximately 100 cc of serosanguineous fluid.  There is minimal drainage from her axillary drain.  Pathology discussed with patient and copy below.  I am unsure if the patient was stripping the drain and I was able to get a lot of drainage after I did this.  I discussed with her the importance of stripping and recording the drain output.  There was some fullness on exam and so there is likely some seroma fluid within the resection cavity.  For this reason I will leave both drains in and see her back in 2 weeks.  FINAL DIAGNOSIS       1. Lymph node, sentinel, biopsy, 1# from right axilla :      - ONE LYMPH NODE NEGATIVE FOR MALIGNANCY (0/1).       2. Breast, simple mastectomy, right breast :      - DUCTAL CARCINOMA IN SITU (DCIS).      - NEGATIVE FOR RESIDUAL INVASIVE CARCINOMA.      - SEE CANCER SUMMARY AND NOTE BELOW.      - COMPLEX SCLEROSING LESIONS, PSEUDOANGIOMATOUS STROMAL HYPERPLASIA, AND      FIBROADENOMA.      - TWO PRIOR BIOPSY SITES WITH CORRESPONDING CLIPS.      - UNREMARKABLE NIPPLE AND SKIN.       3. Lymph node, sentinel, biopsy, 2# from right axilla :      - THREE LYMPH NODES NEGATIVE FOR MALIGNANCY (0/3).       Diagnosis Note : The slides on the patient's prior breast biopsy ([id number])      were reviewed in conjunction with this case.The cancer summary  incorporates the      findings from both the prior biopsy and the current mastectomy / lymph node      specimens.DCIS is estimated to measure at least 35 mm.    I also discussed that she will not need postoperative radiation and is unlikely to need any chemotherapy but will likely require hormonal therapy.  I will defer this final decision up to her oncologist.

## 2024-04-07 ENCOUNTER — Encounter: Admitting: General Surgery

## 2024-04-09 ENCOUNTER — Encounter: Payer: Self-pay | Admitting: General Surgery

## 2024-04-09 ENCOUNTER — Ambulatory Visit (INDEPENDENT_AMBULATORY_CARE_PROVIDER_SITE_OTHER): Admitting: General Surgery

## 2024-04-09 VITALS — BP 139/77 | HR 65 | Ht 63.5 in | Wt 180.0 lb

## 2024-04-09 DIAGNOSIS — C50911 Malignant neoplasm of unspecified site of right female breast: Secondary | ICD-10-CM

## 2024-04-09 NOTE — Patient Instructions (Addendum)
 Follow-up with our office next Thursday. Keep a daily record of your drain.   Please call and ask to speak with a nurse if you develop questions or concerns.   Skin Sparing Mastectomy, Care After The following information offers guidance on how to care for yourself after your procedure. Your health care provider may also give you more specific instructions. If you have problems or questions, contact your health care provider. What can I expect after the procedure? After the procedure, it is common to have: Pain and breast tenderness. Breast swelling. Numbness or tingling. Stiffness in your arm or shoulder. A change in breast shape. A change in how the breast feels. Follow these instructions at home: Incision care  Avoid wearing a bra following your procedure to allow the incision to heal. Your health care provider will tell you when it is safe to wear a bra. Follow instructions from your health care provider about how to take care of your incision. Make sure you: Wash your hands with soap and water for at least 20 seconds before and after you change your bandage (dressing). If soap and water are not available, use hand sanitizer. Change your dressing as told by your health care provider. Keep your dressing dry. Leave stitches (sutures), skin glue, or adhesive strips in place. These skin closures may need to stay in place for 2 weeks or longer. If adhesive strip edges start to loosen and curl up, you may trim the loose edges. Do not remove adhesive strips completely unless your health care provider tells you to do that. Check your incision area and drain area every day for signs of infection. Check for: Redness, swelling, or pain. Fluid or blood. Warmth. Pus or a bad smell. Medicines Take over-the-counter and prescription medicines only as told by your health care provider. Ask your health care provider if the medicine prescribed to you: Requires you to avoid driving or using  machinery. Can cause constipation. You may need to take these actions to prevent or treat constipation: Drink enough fluid to keep your urine pale yellow. Take over-the-counter or prescription medicines. Eat foods that are high in fiber, such as beans, whole grains, and fresh fruits and vegetables. Limit foods that are high in fat and processed sugars, such as fried or sweet foods. Activity  Avoid exercise that requires a lot of effort (is strenuous). Be careful to avoid any activities that could cause an injury to the arm that is on the side of your surgery. You may have to avoid lifting. Ask your health care provider how much you can safely lift. Avoid lifting with the arm that is on the side of your surgery. Ask for help with child care, meal preparation, laundry, and shopping, if you are responsible for these things. Return to your normal activities as told by your health care provider. Ask your health care provider what activities are safe for you. General instructions  Do not take baths, swim, or use a hot tub until your health care provider approves. Ask your health care provider if you may take showers. You may only be allowed to take sponge baths. Do not drive until your health care provider approves. If you have a drain, empty the fluid from the removable drain bulb as directed by your health care provider. Keep all follow-up visits. This is important. Contact a health care provider if: You cannot eat or drink without vomiting. You have more redness, swelling, or pain around your incision or drain. You have fluid  or blood coming from your incision or drain. Your incision or drain area feels warm to the touch. You have pus or a bad smell coming from your incision or drain. You develop a fever. Get help right away if: You have new or sudden chest pain. You have chest pain or trouble breathing that gets worse. These symptoms may be an emergency. Get help right away. Call 911. Do  not wait to see if the symptoms will go away. Do not drive yourself to the hospital. Summary Check your incision area and drain area every day for signs of infection, such as redness, swelling, pain, fluid, blood, warmth, pus, or a bad smell. Contact your health care provider if you have a fever. Ask your health care provider if the medicine prescribed to you requires you to avoid driving or using machinery. Keep all follow-up visits. This is important. This information is not intended to replace advice given to you by your health care provider. Make sure you discuss any questions you have with your health care provider. Document Revised: 11/27/2021 Document Reviewed: 11/27/2021 Elsevier Patient Education  2024 [employer].

## 2024-04-09 NOTE — Progress Notes (Signed)
 Angela Greer returns today status post right mastectomy with sentinel lymph node biopsy.  She reports doing well.  She says there is no drainage from the wound.  She says that her pain is well-controlled.  She is hopeful to get one of her drains out.  On exam her incision is healing well without any erythema.  She has 2 drains in place that are putting out serous fluid.  We will remove the drain that is putting out the least today.  She still has 1 drain in place.  Will plan to see her in 1 week to evaluate if we can remove it at that time.

## 2024-04-13 ENCOUNTER — Telehealth: Payer: Self-pay | Admitting: Licensed Clinical Social Worker

## 2024-04-13 ENCOUNTER — Encounter

## 2024-04-13 NOTE — Telephone Encounter (Signed)
 I contacted Ms. Campion to discuss her genetic testing results. No pathogenic variants were identified in the 40 genes analyzed. Detailed clinic note to follow.   The test report has been scanned into EPIC and is located under the Molecular Pathology section of the Results Review tab.  A portion of the result report is included below for reference.      Valri Gee, MS, [facility] Genetic Counselor [email or web address] Phone: [phone]

## 2024-04-14 ENCOUNTER — Encounter: Payer: Self-pay | Admitting: Licensed Clinical Social Worker

## 2024-04-14 ENCOUNTER — Ambulatory Visit: Payer: Self-pay | Admitting: Licensed Clinical Social Worker

## 2024-04-14 ENCOUNTER — Ambulatory Visit: Admitting: Oncology

## 2024-04-14 DIAGNOSIS — Z1379 Encounter for other screening for genetic and chromosomal anomalies: Secondary | ICD-10-CM

## 2024-04-14 NOTE — Progress Notes (Signed)
 HPI:   Ms. Waheed was previously seen in the [facility] due to a personal and family history of cancer and concerns regarding a hereditary predisposition to cancer. Please refer to our prior cancer genetics clinic note for more information regarding our discussion, assessment and recommendations, at the time. Ms. Bateson recent genetic test results were disclosed to her, as were recommendations warranted by these results. These results and recommendations are discussed in more detail below.  CANCER HISTORY:  Oncology History  Invasive ductal carcinoma of right breast (HCC)  02/28/2024 Initial Diagnosis   Invasive ductal carcinoma of right breast (HCC)   02/28/2024 Cancer Staging   Staging form: Breast, AJCC 8th Edition - Clinical stage from 02/28/2024: Stage IIA (cT3, cN0, cM0, G2, ER+, PR+, HER2-) - Signed by Shellie Dials, MD on 02/28/2024 Stage prefix: Initial diagnosis Histologic grading system: 3 grade system    Genetic Testing   Negative genetic testing. No pathogenic variants identified on the [facility] CancerNext+RNA panel. The report date is 04/10/2024.  The [facility] CancerNext+RNAinsight Panel includes sequencing, rearrangement analysis, and RNA analysis for the following 40 genes: APC, ATM, BAP1, BARD1, BMPR1A, BRCA1, BRCA2, BRIP1, CDH1, CDKN2A, CHEK2, FH, FLCN, MET, MLH1, MSH2, MSH6, MUTYH, NF1, NTHL1, PALB2, PMS2, PTEN, RAD51C, RAD51D, RPS20, SMAD4, STK11, TP53, TSC1, TSC2, and VHL (sequencing and deletion/duplication); AXIN2, HOXB13, MBD4, MSH3, POLD1 and POLE (sequencing only); EPCAM and GREM1 (deletion/duplication only).     FAMILY HISTORY:  We obtained a detailed, 4-generation family history.  Significant diagnoses are listed below: Family History  Problem Relation Age of Onset   Breast cancer Sister        dx 88s   Breast cancer Sister        dx 51s   Breast cancer Paternal Aunt    Breast cancer Paternal Aunt    Breast cancer Paternal Grandmother     Breast cancer Cousin    Breast cancer Cousin   Ms. Opperman does not have children. She has 2 sisters and had 2 brothers. Both of her sisters have had breast cancer in their 40s.    Ms. Krawczyk mother is living at 64 and had a "knot" removed in her breast that she was told would become cancer. Two maternal cousins have had breast cancer, limited information.   Ms. Bauer father passed at 19. Patient had two paternal aunts that had breast cancer and paternal grandmother had breast cancer.   Ms. Mungin is unaware of previous family history of genetic testing for hereditary cancer risks. There is no reported Ashkenazi Jewish ancestry. There is no known consanguinity.       GENETIC TEST RESULTS:  The [facility] CancerNext+RNA Panel found no pathogenic mutations.   The [facility] CancerNext+RNAinsight Panel includes sequencing, rearrangement analysis, and RNA analysis for the following 40 genes: APC, ATM, BAP1, BARD1, BMPR1A, BRCA1, BRCA2, BRIP1, CDH1, CDKN2A, CHEK2, FH, FLCN, MET, MLH1, MSH2, MSH6, MUTYH, NF1, NTHL1, PALB2, PMS2, PTEN, RAD51C, RAD51D, RPS20, SMAD4, STK11, TP53, TSC1, TSC2, and VHL (sequencing and deletion/duplication); AXIN2, HOXB13, MBD4, MSH3, POLD1 and POLE (sequencing only); EPCAM and GREM1 (deletion/duplication only).   The test report has been scanned into EPIC and is located under the Molecular Pathology section of the Results Review tab.  A portion of the result report is included below for reference. Genetic testing reported out on 04/10/2024.      Even though a pathogenic variant was not identified, possible explanations for the cancer in the family may include: There may be no  hereditary risk for cancer in the family. The cancers in Ms. Huie and/or her family may be sporadic/familial or due to other genetic and environmental factors. There may be a gene mutation in one of these genes that current testing methods cannot detect but that chance is small. There could be another gene  that has not yet been discovered, or that we have not yet tested, that is responsible for the cancer diagnoses in the family.  It is also possible there is a hereditary cause for the cancer in the family that Ms. Locken did not inherit. Therefore, it is important to remain in touch with cancer genetics in the future so that we can continue to offer Ms. Henslee the most up to date genetic testing.   ADDITIONAL GENETIC TESTING:  We discussed with Ms. Breslin that her genetic testing was fairly extensive.  If there are additional relevant genes identified to increase cancer risk that can be analyzed in the future, we would be happy to discuss and coordinate this testing at that time.    CANCER SCREENING RECOMMENDATIONS:  Ms. Sakuma test result is considered negative (normal).  This means that we have not identified a hereditary cause for her personal and family history of cancer at this time.   An individual's cancer risk and medical management are not determined by genetic test results alone. Overall cancer risk assessment incorporates additional factors, including personal medical history, family history, and any available genetic information that may result in a personalized plan for cancer prevention and surveillance. Therefore, it is recommended she continue to follow the cancer management and screening guidelines provided by her oncology and primary healthcare provider.  RECOMMENDATIONS FOR FAMILY MEMBERS:   Since she did not inherit a identifiable mutation in a cancer predisposition gene included on this panel, her children could not have inherited a known mutation from her in one of these genes. Individuals in this family might be at some increased risk of developing cancer, over the general population risk, due to the family history of cancer.  Individuals in the family should notify their providers of the family history of cancer. We recommend women in this family have a yearly mammogram beginning  at age 29, or 11 years younger than the earliest onset of cancer, an annual clinical breast exam, and perform monthly breast self-exams.  Family members should have colonoscopies by at age 17, or earlier, as recommended by their providers. Other members of the family may still carry a pathogenic variant in one of these genes that Ms. Vicencio did not inherit. Based on the family history, we recommend those who have had breast cancer have genetic counseling and testing. Ms. Dunsmore will let us  know if we can be of any assistance in coordinating genetic counseling and/or testing for this family member.    FOLLOW-UP:  Lastly, we discussed with Ms. Gates that cancer genetics is a rapidly advancing field and it is possible that new genetic tests will be appropriate for her and/or her family members in the future. We encouraged her to remain in contact with cancer genetics on an annual basis so we can update her personal and family histories and let her know of advances in cancer genetics that may benefit this family.   Our contact number was provided. Ms. Kuhlmann questions were answered to her satisfaction, and she knows she is welcome to call us  at anytime with additional questions or concerns.    Valri Gee, MS, [facility] Genetic Counselor [email or web address] Phone: [phone]

## 2024-04-15 ENCOUNTER — Encounter: Payer: Self-pay | Admitting: *Deleted

## 2024-04-15 ENCOUNTER — Other Ambulatory Visit: Payer: Self-pay | Admitting: *Deleted

## 2024-04-15 ENCOUNTER — Ambulatory Visit: Attending: Oncology | Admitting: Occupational Therapy

## 2024-04-15 ENCOUNTER — Inpatient Hospital Stay: Attending: Oncology | Admitting: Oncology

## 2024-04-15 ENCOUNTER — Encounter: Payer: Self-pay | Admitting: Oncology

## 2024-04-15 VITALS — BP 120/65 | HR 55 | Temp 98.0°F | Resp 16 | Ht 63.5 in | Wt 182.3 lb

## 2024-04-15 DIAGNOSIS — Z9011 Acquired absence of right breast and nipple: Secondary | ICD-10-CM | POA: Insufficient documentation

## 2024-04-15 DIAGNOSIS — Z1732 Human epidermal growth factor receptor 2 negative status: Secondary | ICD-10-CM | POA: Diagnosis not present

## 2024-04-15 DIAGNOSIS — Z87891 Personal history of nicotine dependence: Secondary | ICD-10-CM | POA: Insufficient documentation

## 2024-04-15 DIAGNOSIS — Z170 Estrogen receptor positive status [ER+]: Secondary | ICD-10-CM | POA: Diagnosis not present

## 2024-04-15 DIAGNOSIS — M810 Age-related osteoporosis without current pathological fracture: Secondary | ICD-10-CM | POA: Diagnosis not present

## 2024-04-15 DIAGNOSIS — Z79811 Long term (current) use of aromatase inhibitors: Secondary | ICD-10-CM | POA: Diagnosis not present

## 2024-04-15 DIAGNOSIS — Z1721 Progesterone receptor positive status: Secondary | ICD-10-CM | POA: Insufficient documentation

## 2024-04-15 DIAGNOSIS — M25611 Stiffness of right shoulder, not elsewhere classified: Secondary | ICD-10-CM | POA: Insufficient documentation

## 2024-04-15 DIAGNOSIS — C50911 Malignant neoplasm of unspecified site of right female breast: Secondary | ICD-10-CM | POA: Diagnosis present

## 2024-04-15 DIAGNOSIS — R293 Abnormal posture: Secondary | ICD-10-CM | POA: Diagnosis not present

## 2024-04-15 MED ORDER — LETROZOLE 2.5 MG PO TABS: 2.5000 mg | ORAL_TABLET | Freq: Every day | ORAL | 3 refills | Status: DC

## 2024-04-15 MED ORDER — ALENDRONATE SODIUM 70 MG PO TABS: 70.0000 mg | ORAL_TABLET | ORAL | 3 refills | Status: DC

## 2024-04-15 MED ORDER — TAMOXIFEN CITRATE 20 MG PO TABS: 20.0000 mg | ORAL_TABLET | Freq: Every day | ORAL | 3 refills | Status: DC

## 2024-04-15 NOTE — Progress Notes (Signed)
 [facility]  [phone] [phone]) [phone]  ID: Angela Greer OB: November 24, 1950  MR#: [id number]  AOZ#:[id number]  Patient Care Team: Tawnya Fava, Waddell Guess, MD as PCP - General (Family Medicine) Shellie Dials, MD as Consulting Physician (Oncology)  CHIEF COMPLAINT: Pathologic stage Ia ER/PR positive, HER2 negative invasive carcinoma of the right breast.  INTERVAL HISTORY: Patient returns to clinic today postoperatively for further evaluation, discussion of her final pathology results, and treatment planning.  She tolerated her mastectomy well without significant side effects.  She continues to have 1 drain in, but anticipates this being removed tomorrow.  She otherwise feels well and is asymptomatic.  She has no neurologic complaints.  She denies any recent fevers or illnesses.  She has a good appetite and denies weight loss.  She has no chest pain, shortness of breath, cough, or hemoptysis.  She denies any nausea, vomiting, constipation, or diarrhea.  She has no urinary complaints.  Patient offers no further specific complaints today.  REVIEW OF SYSTEMS:   Review of Systems  Constitutional: Negative.  Negative for fever and malaise/fatigue.  Respiratory: Negative.  Negative for cough, hemoptysis and shortness of breath.   Cardiovascular: Negative.  Negative for chest pain and leg swelling.  Gastrointestinal: Negative.  Negative for abdominal pain.  Genitourinary: Negative.   Musculoskeletal: Negative.  Negative for back pain.  Skin: Negative.  Negative for rash.  Neurological: Negative.  Negative for dizziness, focal weakness and headaches.  Psychiatric/Behavioral: Negative.  The patient is not nervous/anxious.     As per HPI. Otherwise, a complete review of systems is negative.  PAST MEDICAL HISTORY: Past Medical History:  Diagnosis Date   Acid reflux    Avascular necrosis of femoral head (HCC) 05/17/2007   Diverticulitis    Dupuytren's  contracture of right hand 05/17/2007   Gout    History of hiatal hernia    Hyperlipidemia, mixed 05/17/2007   Hypertension    Invasive ductal carcinoma of right breast (HCC) 02/28/2024   PONV (postoperative nausea and vomiting)     PAST SURGICAL HISTORY: Past Surgical History:  Procedure Laterality Date   BREAST BIOPSY Right 02/24/2024   Stereo bx, x-clip,path pending   BREAST BIOPSY Right 02/24/2024   Stereo bx, Coil Clip, path pending   BREAST BIOPSY Right 02/24/2024   MM RT BREAST BX W LOC DEV 1ST LESION IMAGE BX SPEC STEREO GUIDE 02/24/2024 [facility]   BREAST BIOPSY Right 02/24/2024   MM RT BREAST BX W LOC DEV EA AD LESION IMG BX SPEC STEREO GUIDE 02/24/2024 [facility]   HERNIA REPAIR Right    inguinal   MASTECTOMY W/ SENTINEL NODE BIOPSY Right 03/23/2024   Procedure: MASTECTOMY WITH SENTINEL LYMPH NODE BIOPSY;  Surgeon: Barrett Lick, MD;  Location: [facility];  Service: General;  Laterality: Right;   TOTAL HIP ARTHROPLASTY Bilateral     FAMILY HISTORY: Family History  Problem Relation Age of Onset   Breast cancer Sister        dx 30s   Breast cancer Sister        dx 52s   Breast cancer Paternal Aunt    Breast cancer Paternal Aunt    Breast cancer Paternal Grandmother    Breast cancer Cousin    Breast cancer Cousin     ADVANCED DIRECTIVES (Y/N):  N  HEALTH MAINTENANCE: Social History   Tobacco Use   Smoking status: Former    Passive exposure: Past   Smokeless tobacco: Never  Vaping Use  Vaping status: Never Used  Substance Use Topics   Alcohol  use: No   Drug use: No     Colonoscopy:  PAP:  Bone density:  Lipid panel:  Allergies  Allergen Reactions   Pravastatin     REACTION: constipation   Pravastatin Sodium     REACTION: constipation   Simvastatin     REACTION: along with other statins causes myalgias.  CPK remains normal.    Current Outpatient Medications  Medication Sig Dispense Refill   acetaminophen  (TYLENOL ) 500 MG  tablet Take 1,000 mg by mouth every 6 (six) hours as needed for moderate pain (pain score 4-6) or mild pain (pain score 1-3).     allopurinol  (ZYLOPRIM ) 100 MG tablet Take 100 mg by mouth daily.     Calcium -Magnesium-Vitamin D 600-40-500 MG-MG-UNIT TB24 Take 1 tablet by mouth 2 (two) times daily.     carboxymethylcellulose 1 % ophthalmic solution Place 1 drop into both eyes 3 (three) times daily. Refresh     docusate sodium  (STOOL SOFTENER) 100 MG capsule Take 100 mg by mouth daily as needed for mild constipation or moderate constipation.     hydrochlorothiazide (HYDRODIURIL) 25 MG tablet Take 25 mg by mouth daily.     latanoprost  (XALATAN ) 0.005 % ophthalmic solution Place 1 drop into both eyes at bedtime.     letrozole (FEMARA) 2.5 MG tablet Take 1 tablet (2.5 mg total) by mouth daily. 30 tablet 3   losartan (COZAAR) 25 MG tablet TAKE 1 TABLET BY MOUTH EVERY DAY FOR 30 DAYS     montelukast  (SINGULAIR ) 10 MG tablet Take 10 mg by mouth daily.     ondansetron  (ZOFRAN -ODT) 4 MG disintegrating tablet Take 1 tablet (4 mg total) by mouth every 8 (eight) hours as needed for nausea or vomiting. 20 tablet 0   pantoprazole  (PROTONIX ) 40 MG tablet Take 40 mg by mouth daily.     rosuvastatin  (CRESTOR ) 10 MG tablet Take 10 mg by mouth at bedtime.     Omega-3 Fatty Acids (FISH OIL) 1200 MG CAPS Take 1,200 mg by mouth 2 (two) times daily. (Patient not taking: Reported on 04/15/2024)     potassium chloride  SA (KLOR-CON  M) 20 MEQ tablet Take 2 tablets (40 mEq) today, then 1 tablet (20 mEq) daily until surgery. Be sure to take dose on day of surgery. Follow up with PCP for repeat labs. (Patient not taking: Reported on 04/15/2024) 8 tablet 0   No current facility-administered medications for this visit.    OBJECTIVE: Vitals:   04/15/24 0927  BP: 120/65  Pulse: (!) 55  Resp: 16  Temp: 98 F (36.7 C)  SpO2: 97%     Body mass index is 31.79 kg/m.    ECOG FS:0 - Asymptomatic  General: Well-developed,  well-nourished, no acute distress. Eyes: Pink conjunctiva, anicteric sclera. HEENT: Normocephalic, moist mucous membranes. Breast: Well-healed surgical scar on right breast.  JP drain in place. Lungs: No audible wheezing or coughing. Heart: Regular rate and rhythm. Abdomen: Soft, nontender, no obvious distention. Musculoskeletal: No edema, cyanosis, or clubbing. Neuro: Alert, answering all questions appropriately. Cranial nerves grossly intact. Skin: No rashes or petechiae noted. Psych: Normal affect.    LAB RESULTS:  Lab Results  Component Value Date   NA 139 03/23/2024   K 3.4 (L) 03/23/2024   CL 103 03/23/2024   CO2 27 03/16/2024   GLUCOSE 88 03/23/2024   BUN 20 03/23/2024   CREATININE 0.80 03/23/2024   CALCIUM  10.0 03/16/2024   PROT 7.3  04/01/2017   ALBUMIN 4.0 04/01/2017   AST 27 04/01/2017   ALT 31 04/01/2017   ALKPHOS 100 04/01/2017   BILITOT 0.5 04/01/2017   GFRNONAA >60 03/16/2024   GFRAA 57 (L) 04/01/2017    Lab Results  Component Value Date   WBC 6.9 03/16/2024   HGB 15.6 (H) 03/23/2024   HCT 46.0 03/23/2024   MCV 90.3 03/16/2024   PLT 191 03/16/2024     STUDIES: NM Sentinel Node Inj-No Rpt (Breast) Result Date: 03/23/2024 Lymphoseek was injected by the Nuclear Medicine Technologist for sentinel lymph node localization.    ASSESSMENT: Pathologic stage Ia ER/PR positive, HER2 negative invasive carcinoma of the right breast.  PLAN:    Pathologic stage Ia ER/PR positive, HER2 negative invasive carcinoma of the right breast: Case discussed at tumor board earlier this week with downstaging of patient's malignancy after mastectomy on Mar 23, 2024.  Patient does not require Oncotype testing or chemotherapy.  She also does not require adjuvant XRT given that she had a mastectomy.  She was given a prescription for tamoxifen today given her underlying osteoporosis.  Recommendation is to take 5 years of treatment completing in June 2030.  No further intervention  is needed.  Return to clinic in 3 months for further evaluation.   Postmastectomy care: Patient has an appointment with surgery tomorrow with plans to remove the final drain.   Genetics: Negative.   Bone health: Patient had a bone marrow density on January 28, 2024 that revealed osteoporosis with a reported T-score of -3.7.  Tamoxifen as above.   Patient expressed understanding and was in agreement with this plan. She also understands that She can call clinic at any time with any questions, concerns, or complaints.    Cancer Staging  Invasive ductal carcinoma of right breast [facility]) Staging form: Breast, AJCC 8th Edition - Pathologic stage from 04/15/2024: Stage IA (pT1a, pN0, cM0, G2, ER+, PR+, HER2-) - Signed by Shellie Dials, MD on 04/15/2024 Stage prefix: Initial diagnosis Histologic grading system: 3 grade system   Shellie Dials, MD   04/15/2024 10:08 AM

## 2024-04-15 NOTE — Therapy (Signed)
 OUTPATIENT OCCUPATIONAL THERAPY BREAST CANCER POST OP EVALUATION   Patient Name: Angela Greer MRN: [id number] DOB:1951-07-19, 73 y.o., female Today's Date: 04/15/2024  END OF SESSION:  OT End of Session - 04/15/24 1948     Visit Number 2    Number of Visits 6    Date for OT Re-Evaluation 05/27/24    OT Start Time 1030    OT Stop Time 1055    OT Time Calculation (min) 25 min    Activity Tolerance Patient tolerated treatment well    Behavior During Therapy [facility] for tasks assessed/performed             Past Medical History:  Diagnosis Date   Acid reflux    Avascular necrosis of femoral head (HCC) 05/17/2007   Diverticulitis    Dupuytren's contracture of right hand 05/17/2007   Gout    History of hiatal hernia    Hyperlipidemia, mixed 05/17/2007   Hypertension    Invasive ductal carcinoma of right breast (HCC) 02/28/2024   PONV (postoperative nausea and vomiting)    Past Surgical History:  Procedure Laterality Date   BREAST BIOPSY Right 02/24/2024   Stereo bx, x-clip,path pending   BREAST BIOPSY Right 02/24/2024   Stereo bx, Coil Clip, path pending   BREAST BIOPSY Right 02/24/2024   MM RT BREAST BX W LOC DEV 1ST LESION IMAGE BX SPEC STEREO GUIDE 02/24/2024 [facility]   BREAST BIOPSY Right 02/24/2024   MM RT BREAST BX W LOC DEV EA AD LESION IMG BX SPEC STEREO GUIDE 02/24/2024 [facility]   HERNIA REPAIR Right    inguinal   MASTECTOMY W/ SENTINEL NODE BIOPSY Right 03/23/2024   Procedure: MASTECTOMY WITH SENTINEL LYMPH NODE BIOPSY;  Surgeon: Barrett Lick, MD;  Location: [facility];  Service: General;  Laterality: Right;   TOTAL HIP ARTHROPLASTY Bilateral    Patient Active Problem List   Diagnosis Date Noted   Genetic testing 04/14/2024   History of right breast cancer 03/23/2024   Invasive ductal carcinoma of right breast (HCC) 02/28/2024   HYPERLIPIDEMIA, MIXED 05/17/2007   OBESITY 05/17/2007   ULNAR NEUROPATHY, RIGHT 05/17/2007   ALLERGIC  RHINITIS 05/17/2007   GASTROESOPHAGEAL REFLUX DISEASE 05/17/2007   HIATAL HERNIA 05/17/2007   CYSTITIS NOS 05/17/2007   DEGENERATIVE JOINT DISEASE, HIPS 05/17/2007   DUPUYTREN'S CONTRACTURE, RIGHT 05/17/2007   AVASCULAR NECROSIS, FEMORAL HEAD 05/17/2007   ABNORMAL PAP SMEAR 11/05/1988    PCP: Dr Gloria Lares  REFERRING PROVIDER: Dr   Waunita Haff DIAG: DR Adrian Alba  THERAPY DIAG:  Stiffness of right shoulder, not elsewhere classified  Rationale for Evaluation and Treatment: Rehabilitation  ONSET DATE: 03/23/24  SUBJECTIVE:  SUBJECTIVE STATEMENT: Patient reports she had right mastectomy on 03/23/2024.  1 drain was taken out but she still has 1 left. PERTINENT HISTORY:  Patient was diagnosed with right  breast cancer -patient had R mastectomy by Dr. Cornel Diesel on 03/23/2024.  Patient had 1 drain removed.  Has 1 left.  PATIENT GOALS:   reduce lymphedema risk and learn post op HEP.   PAIN:  Are you having pain?  A pull when I reach overhead with the drainage.  PRECAUTIONS: Active CA      HAND DOMINANCE: right  WEIGHT BEARING RESTRICTIONS: No  FALLS:  Has patient fallen in last 6 months? No  LIVING ENVIRONMENT: Patient lives with: Alone   OCCUPATION: Retired  LEISURE: Scientist, physiological, do some cooking and cleaning -apartment   OBJECTIVE:  COGNITION: Overall cognitive status: Within functional limits for tasks assessed    POSTURE:  Forward head and rounded shoulders posture  UPPER EXTREMITY AROM/PROM:  A/PROM RIGHT   eval  R 04/15/24  Shoulder extension    Shoulder flexion 135 110  Shoulder abduction 135 scaption 90  Shoulder internal rotation    Shoulder external rotation 65 55    (Blank rows = not tested)  A/PROM LEFT   eval  Shoulder extension   Shoulder flexion 150  Shoulder  abduction 135 scaption  Shoulder internal rotation   Shoulder external rotation 55    (Blank rows = not tested)  CERVICAL AROM: Forward head    UPPER EXTREMITY STRENGTH: At evaluation 5-/5 at 90 degrees of shoulder AROM -and ext and int at side  LYMPHEDEMA ASSESSMENTS:   LANDMARK RIGHT   eval R 04/15/24  10 cm proximal to olecranon process  15 cm - 36.4 10 cm - 38 15 cm - 34.5 10 cm -38  Olecranon process 34 31  10 cm proximal to ulnar styloid process    Just proximal to ulnar styloid process    Across hand at thumb web space    At base of 2nd digit    (Blank rows = not tested)  LANDMARK LEFT   eval L 04/15/24  10 cm proximal to olecranon process 35.2 34.5  Olecranon process 32.7 30.8  10 cm proximal to ulnar styloid process    Just proximal to ulnar styloid process    Across hand at thumb web space    At base of 2nd digit    (Blank rows = not tested)  L-DEX LYMPHEDEMA SCREENING:  The patient was assessed using the L-Dex machine today to produce a lymphedema index baseline score. The patient will be reassessed on a regular basis (typically every 3 months) to obtain new L-Dex scores. If the score is > 6.5 points away from his/her baseline score indicating onset of subclinical lymphedema, it will be recommended to wear a compression garment for 4 weeks, 12 hours per day and then be reassessed. If the score continues to be > 6.5 points from baseline at reassessment, we will initiate lymphedema treatment. Assessing in this manner has a 95% rate of preventing clinically significant lymphedema.   L-DEX FLOWSHEETS - 04/15/24 1900       L-DEX LYMPHEDEMA SCREENING   Measurement Type Unilateral    L-DEX MEASUREMENT EXTREMITY Upper Extremity    POSITION  Seated    DOMINANT SIDE Right    At Risk Side Right    BASELINE SCORE (UNILATERAL) 2.1                PATIENT EDUCATION:  Education details: Lymphedema risk  reduction and post op shoulder/posture HEP Person educated:  Patient Education method: Explanation, Demonstration, Handout Education comprehension: Patient verbalized understanding and returned demonstration  HOME EXERCISE PROGRAM: Patient was instructed today in a home exercise program again for post op shoulder range of motion. These included active assist shoulder flexion and abduction supine, and ext rotation - 10 reps - 3 x day - Patient still has 1 drain in.  Reinforced with patient to keep it pain-free and under 120 degrees.  Until drain removed and okayed by Dr. Cornel Diesel to go overhead.  Scapular retraction,  She was encouraged to do these 2-3 x day, holding 3 seconds and repeating 10 times when permitted by her physician/surgeon.   ASSESSMENT:  CLINICAL IMPRESSION: Patient presented OT evaluation postop right mastectomy by Dr. Cornel Diesel.  On 03/23/2024.  Patient has still 1 drain in.  Other 1 removed.  Patient still decreased shoulder range of motion on the right.  In all planes.  Reviewed with patient again home exercises to keep pain-free and after follow-up with surgeon can go overhead when okayed by surgeon.  Patient to continue with compression.  And follow-up with me after drain removed.  She will benefit from a post op OT reassessment to determine needs and from L-Dex screens every 3 months for 2 years to detect subclinical lymphedema.  Pt will benefit from skilled therapeutic intervention to improve on the following deficits: Decreased knowledge of precautions and lymphedema education, impaired UE functional use, pain, decreased ROM, postural dysfunction.   OT treatment/interventions: ADL/self-care home management, pt/family education, therapeutic exercise,manual therapy  REHAB POTENTIAL: Good  CLINICAL DECISION MAKING: Stable/uncomplicated  EVALUATION COMPLEXITY: Low   GOALS: Goals reviewed with patient? YES  LONG TERM GOALS: (STG=LTG)    Name Target Date Goal status  1 Pt will be able to verbalize understanding of pertinent  lymphedema risk reduction practices relevant to her dx specifically related to skin care.  Baseline:  No knowledge 12 wks initial  2 Pt will be able to return demo and/or verbalize understanding of the post op HEP related to regaining shoulder ROM. Baseline:  No knowledge 12 wks  progressing       4 Pt will demo she has regained full shoulder ROM and function post operatively compared to baselines.  Baseline: See objective measurements taken today. 12 wks Initial    PLAN:  OT FREQUENCY/DURATION: EVAL and 6 follow up appointment dep on progress.   PLAN FOR NEXT SESSION: will reassess 3-4 weeks post op to determine needs.   Patient will follow up at outpatient cancer rehab 3-4 weeks following surgery.  If the patient requires occupational therapy at that time, a specific plan will be dictated and sent to the referring physician for approval. T Occupational Therapy Information for After Breast Cancer Surgery/Treatment:  Lymphedema is a swelling condition that you may be at risk for in your arm if you have lymph nodes removed from the armpit area.  After a sentinel node biopsy, the risk is approximately 5-9% and is higher after an axillary node dissection.  There is treatment available for this condition and it is not life-threatening.  Contact your physician or occupational therapist with concerns. You may begin the 4 shoulder/posture exercises (see additional sheet) when permitted by your physician (typically a week after surgery).  If you have drains, you may need to wait until those are removed before beginning range of motion exercises.  A general recommendation is to not lift your arms above shoulder height until drains are  removed.  These exercises should be done to your tolerance and gently.  This is not a no pain/no gain type of recovery so listen to your body and stretch into the range of motion that you can tolerate, stopping if you have pain.  If you are having immediate reconstruction,  ask your plastic surgeon about doing exercises as he or she may want you to wait. .  While undergoing any medical procedure or treatment, try to avoid blood pressure being taken or needle sticks from occurring on the arm on the side of cancer.   This recommendation begins after surgery and continues for the rest of your life.  This may help reduce your risk of getting lymphedema (swelling in your arm). An excellent resource for those seeking information on lymphedema is the National Lymphedema Network's web site. It can be accessed at www.lymphnet.org If you notice swelling in your hand, arm or breast at any time following surgery (even if it is many years from now), please contact your doctor or occupational therapist to discuss this.  Lymphedema can be treated at any time but it is easier for you if it is treated early on.  If you feel like your shoulder motion is not returning to normal in a reasonable amount of time, please contact your surgeon or occupational therapist.  [facility] [phone]. [address] [place], [place] [address]      Heloise Lobo, OTR/L,CLT 04/15/2024, 7:55 PM

## 2024-04-16 ENCOUNTER — Ambulatory Visit: Admitting: General Surgery

## 2024-04-16 ENCOUNTER — Telehealth: Payer: Self-pay | Admitting: *Deleted

## 2024-04-16 ENCOUNTER — Encounter: Payer: Self-pay | Admitting: General Surgery

## 2024-04-16 VITALS — BP 131/74 | HR 62 | Temp 97.7°F | Ht 63.5 in | Wt 181.4 lb

## 2024-04-16 DIAGNOSIS — Z08 Encounter for follow-up examination after completed treatment for malignant neoplasm: Secondary | ICD-10-CM

## 2024-04-16 DIAGNOSIS — C50911 Malignant neoplasm of unspecified site of right female breast: Secondary | ICD-10-CM

## 2024-04-16 NOTE — Patient Instructions (Signed)
 Total or Modified Radical Mastectomy, Care After The following information offers guidance on how to care for yourself after your procedure. Your health care provider may also give you more specific instructions. If you have problems or questions, contact your health care provider. What can I expect after the procedure? After the procedure, it is common to have: Pain, soreness, or tenderness. Numbness. Swelling. Neuropathic (nerve) pain in the chest wall, armpit, or arm. Stiffness in the arm or shoulder. Feelings of stress, sadness, or depression. If the lymph nodes under your arm were removed, you may have arm swelling, weakness, or numbness on the same side of your body as your surgery. Follow these instructions at home: Incision care  Follow instructions from your health care provider about how to take care of your incision. Make sure you: Wash your hands with soap and water for at least 20 seconds before you change your bandage (dressing). If soap and water are not available, use hand sanitizer. Change your dressing as told by your health care provider. Leave stitches (sutures), skin glue, or adhesive strips in place. These skin closures may need to stay in place for 2 weeks or longer. If adhesive strip edges start to loosen and curl up, you may trim the loose edges. Do not remove adhesive strips completely unless your health care provider tells you to do that. Check your incision area every day for signs of infection. Check for: Redness, swelling, or more pain. Fluid or blood. Warmth. Pus or a bad smell. If you were sent home with a surgical drain in place, follow instructions from your health care provider. This may include emptying the drain and keeping track of the amount of drainage. Bathing Do not take baths, swim, or use a hot tub until your health care provider approves. Ask your health care provider if you may take showers. You may only be allowed to take sponge  baths. Activity  Return to your normal activities as told by your health care provider. Ask your health care provider what activities are safe for you. It may take several weeks to return to full activity. Avoid activities that take a lot of effort. Rest as told by your health care provider. Ask friends and family to help with chores, including child care, meal preparation, laundry, and shopping. Be careful to avoid any activities that could cause an injury to your arm on the side of your surgery. Do not lift anything that is heavier than 10 lb (4.5 kg), or the limit that you are told, until your health care provider says that it is safe. Avoid lifting with the arm on the side of your surgery. Do not carry heavy objects on your shoulder. After your drain is removed, do exercises to prevent stiffness and swelling in your arm. Talk with your health care provider about which exercises are safe for you. General instructions Take over-the-counter and prescription medicines only as told by your health care provider. You may eat what you usually do. Keep your arm raised (elevated) above the level of your heart when you are sitting or lying down. Do not wear tight jewelry on your arm, wrist, or fingers on the side of your surgery. You may be given a tight sleeve (compression bandage) to wear over your arm on the side of your surgery. Wear this sleeve as told by your health care provider. Ask your health care provider when you can start wearing a bra or using a breast prosthesis. Before you are involved in  certain procedures, such as giving blood or having your blood pressure checked, tell all of your health care providers if lymph nodes under your arm were removed. This is important information. Follow-up Keep all follow-up visits. This is important. Get checked for extra fluid around your lymph nodes (lymphedema) as often as told by your health care provider. Medicines Take over-the-counter and  prescription medicines as told by your health care provider. Take your antibiotic medicine as told by your health care provider. Do not stop taking the antibiotic even if you start to feel better. Ask your health care provider if the medicine prescribed to you: Requires you to avoid driving or using machinery. Can cause constipation. You may need to take these actions to prevent or treat constipation: Drink enough fluid to keep your urine pale yellow. Take over-the-counter or prescription medicines. Eat foods that are high in fiber, such as beans, whole grains, and fresh fruits and vegetables. Limit foods that are high in fat and processed sugars, such as fried or sweet foods. Lifestyle Do not use any products that contain nicotine or tobacco before the procedure. These products include cigarettes, chewing tobacco, and vaping devices, such as e-cigarettes. These can delay incision healing. If you need help quitting, ask your health care provider. Contact a health care provider if: You have chills or a fever. Your pain medicine is not working. Your arm swelling, weakness, or numbness has not improved after a few weeks. You have new swelling in your breast area or arm. You have redness, swelling, or more pain in your incision area. You have fluid or blood coming from your incision. Your incision feels warm to the touch. You have pus or a bad smell coming from your incision. Get help right away if: You have very bad pain in your breast area or arm. You have a sudden onset of chest pain. You have a fast heart rate. You have difficulty breathing. These symptoms may be an emergency. Get help right away. Call 911. Do not wait to see if the symptoms will go away. Do not drive yourself to the hospital. Summary Follow instructions from your health care provider about how to take care of your incision. Check your incision area every day for signs of infection. Ask your health care provider what  activities are safe for you. Keep all follow-up visits. This is important. Contact a health care provider if you have new swelling in your breast area or arm. This information is not intended to replace advice given to you by your health care provider. Make sure you discuss any questions you have with your health care provider. Document Revised: 07/23/2021 Document Reviewed: 07/23/2021 Elsevier Patient Education  2024 [employer].

## 2024-04-16 NOTE — Progress Notes (Signed)
 The patient returns today status post right mastectomy with sentinel lymph node biopsy.  She reports doing well.  She still has little soreness over her incisions but overall the pain is improved.  Her drain is putting out serous fluid and is putting out about 40 cc/day.  She has gone to see physical therapy and she has seen her oncologist who has recommended hormonal therapy for 5 years.  On exam her incision is healing well without any erythema or redness.  Her drain is still putting out serous fluid.  Given the amount I would like to keep it in for several more days.  If it stays the same or comes down over the next several days we can hopefully pull it on her next visit next Thursday.  She can go back to performing all physical therapy exercises.  I also recommended continue compression of the right mastectomy site.

## 2024-04-16 NOTE — Telephone Encounter (Signed)
 Patient is supposed to start on tamoxifen and she forgot to ask him if it is okay to take with all the rest of the medicines that she has.  I thought and talk to fending it and he says that she can do it with all the rest of her medicines.  She is happy that she do not have to do it separately.

## 2024-04-21 ENCOUNTER — Ambulatory Visit (INDEPENDENT_AMBULATORY_CARE_PROVIDER_SITE_OTHER): Admitting: General Surgery

## 2024-04-21 ENCOUNTER — Encounter: Payer: Self-pay | Admitting: General Surgery

## 2024-04-21 VITALS — BP 137/75 | HR 62 | Temp 97.7°F | Ht 63.5 in | Wt 181.0 lb

## 2024-04-21 DIAGNOSIS — Z08 Encounter for follow-up examination after completed treatment for malignant neoplasm: Secondary | ICD-10-CM

## 2024-04-21 DIAGNOSIS — C50911 Malignant neoplasm of unspecified site of right female breast: Secondary | ICD-10-CM

## 2024-04-21 NOTE — Progress Notes (Signed)
 The patient returns today status post cystectomy.  She is about 4 weeks out.  She reports doing well.  She has a drain in place that is putting out about 20 to 30 cc/day of serous fluid.  There is no drainage from the wound.  She says that she still has a little bit of soreness but otherwise it is doing quite well.  Her incision is healing well and still has glue on it.  There is no evidence of infection.  Her drain is serous and it was removed fully in the office today.  She has started her hormonal therapy and is tolerating that overall well.  She will see me again in 4 weeks for checkup.

## 2024-04-21 NOTE — Patient Instructions (Signed)

## 2024-04-29 ENCOUNTER — Inpatient Hospital Stay: Admitting: Occupational Therapy

## 2024-04-29 DIAGNOSIS — R293 Abnormal posture: Secondary | ICD-10-CM

## 2024-04-29 DIAGNOSIS — M25611 Stiffness of right shoulder, not elsewhere classified: Secondary | ICD-10-CM

## 2024-04-29 DIAGNOSIS — C50911 Malignant neoplasm of unspecified site of right female breast: Secondary | ICD-10-CM | POA: Diagnosis not present

## 2024-04-29 NOTE — Therapy (Signed)
 OUTPATIENT OCCUPATIONAL THERAPY BREAST CANCER POST OP TREATMENT   Patient Name: Angela Greer MRN: [id number] DOB:10-Jun-1951, 73 y.o., female Today's Date: 04/29/2024  END OF SESSION:  OT End of Session - 04/29/24 1636     Visit Number 3    Number of Visits 6    Date for OT Re-Evaluation 05/27/24    OT Start Time 1330    OT Stop Time 1401    OT Time Calculation (min) 31 min    Activity Tolerance Patient tolerated treatment well    Behavior During Therapy [facility] for tasks assessed/performed          Past Medical History:  Diagnosis Date   Acid reflux    Avascular necrosis of femoral head (HCC) 05/17/2007   Diverticulitis    Dupuytren's contracture of right hand 05/17/2007   Gout    History of hiatal hernia    Hyperlipidemia, mixed 05/17/2007   Hypertension    Invasive ductal carcinoma of right breast (HCC) 02/28/2024   PONV (postoperative nausea and vomiting)    Past Surgical History:  Procedure Laterality Date   BREAST BIOPSY Right 02/24/2024   Stereo bx, x-clip,path pending   BREAST BIOPSY Right 02/24/2024   Stereo bx, Coil Clip, path pending   BREAST BIOPSY Right 02/24/2024   MM RT BREAST BX W LOC DEV 1ST LESION IMAGE BX SPEC STEREO GUIDE 02/24/2024 [facility]   BREAST BIOPSY Right 02/24/2024   MM RT BREAST BX W LOC DEV EA AD LESION IMG BX SPEC STEREO GUIDE 02/24/2024 [facility]   HERNIA REPAIR Right    inguinal   MASTECTOMY W/ SENTINEL NODE BIOPSY Right 03/23/2024   Procedure: MASTECTOMY WITH SENTINEL LYMPH NODE BIOPSY;  Surgeon: Marinda Jayson KIDD, MD;  Location: [facility];  Service: General;  Laterality: Right;   TOTAL HIP ARTHROPLASTY Bilateral    Patient Active Problem List   Diagnosis Date Noted   Genetic testing 04/14/2024   History of right breast cancer 03/23/2024   Invasive ductal carcinoma of right breast (HCC) 02/28/2024   HYPERLIPIDEMIA, MIXED 05/17/2007   OBESITY 05/17/2007   ULNAR NEUROPATHY, RIGHT 05/17/2007   ALLERGIC RHINITIS  05/17/2007   GASTROESOPHAGEAL REFLUX DISEASE 05/17/2007   HIATAL HERNIA 05/17/2007   CYSTITIS NOS 05/17/2007   DEGENERATIVE JOINT DISEASE, HIPS 05/17/2007   DUPUYTREN'S CONTRACTURE, RIGHT 05/17/2007   AVASCULAR NECROSIS, FEMORAL HEAD 05/17/2007   ABNORMAL PAP SMEAR 11/05/1988    PCP: Dr Verdia  REFERRING PROVIDER:Dr Jacobo  REFERRING DIAG: R breast CA with mastectomy  THERAPY DIAG:  Stiffness of right shoulder, not elsewhere classified  Abnormal posture  Rationale for Evaluation and Treatment: Rehabilitation  ONSET DATE: 03/23/24  SUBJECTIVE:  SUBJECTIVE STATEMENT: Patient reports she had right mastectomy on 03/23/2024.  My last drain got out last week.  Feels better my motion is better.  I do not need any radiation or chemo PERTINENT HISTORY:  Patient was diagnosed with right  breast cancer -patient had R mastectomy by Dr. Marinda on 03/23/2024.  Patient had 1 drain removed.  Has 1 left.  Last drain was removed 04/20/24  PATIENT GOALS:   reduce lymphedema risk and learn post op HEP.   PAIN:  Are you having pain?  No pain  PRECAUTIONS: Active CA      HAND DOMINANCE: right  WEIGHT BEARING RESTRICTIONS: No  FALLS:  Has patient fallen in last 6 months? No  LIVING ENVIRONMENT: Patient lives with: Alone   OCCUPATION: Retired  LEISURE: Scientist, physiological, do some cooking and cleaning -apartment   OBJECTIVE:  COGNITION: Overall cognitive status: Within functional limits for tasks assessed    POSTURE:  Forward head and rounded shoulders posture  UPPER EXTREMITY AROM/PROM:  A/PROM RIGHT   eval  R 04/15/24 R 04/28/24  Shoulder extension     Shoulder flexion 135 110 160  Shoulder abduction 135 scaption 90 160 scaption  Shoulder internal rotation     Shoulder external rotation 65  55 75    (Blank rows = not tested)  A/PROM LEFT   eval  Shoulder extension   Shoulder flexion 150  Shoulder abduction 135 scaption  Shoulder internal rotation   Shoulder external rotation 55    (Blank rows = not tested)  CERVICAL AROM: Forward head    UPPER EXTREMITY STRENGTH: At evaluation 5-/5 at 90 degrees of shoulder AROM -and ext and int at side  LYMPHEDEMA ASSESSMENTS:   LANDMARK RIGHT   eval R 04/15/24 R 04/29/24  10 cm proximal to olecranon process  15 cm - 36.4 10 cm - 38 15 cm - 34.5 10 cm -38 15 cm - 36.4 10 cm -38  Olecranon process 34 31 32  10 cm proximal to ulnar styloid process     Just proximal to ulnar styloid process     Across hand at thumb web space     At base of 2nd digit     (Blank rows = not tested)  LANDMARK LEFT   eval L 04/15/24 L 04/29/24  10 cm proximal to olecranon process 35.2 34.5 15 cm - 34 10 cm -34.4  Olecranon process 32.7 30.8 32  10 cm proximal to ulnar styloid process     Just proximal to ulnar styloid process     Across hand at thumb web space     At base of 2nd digit     (Blank rows = not tested)  L-DEX LYMPHEDEMA SCREENING:  The patient was assessed using the L-Dex machine today to produce a lymphedema index baseline score. The patient will be reassessed on a regular basis (typically every 3 months) to obtain new L-Dex scores. If the score is > 6.5 points away from his/her baseline score indicating onset of subclinical lymphedema, it will be recommended to wear a compression garment for 4 weeks, 12 hours per day and then be reassessed. If the score continues to be > 6.5 points from baseline at reassessment, we will initiate lymphedema treatment. Assessing in this manner has a 95% rate of preventing clinically significant lymphedema.   L-DEX FLOWSHEETS - 04/29/24 1600       L-DEX LYMPHEDEMA SCREENING   Measurement Type Unilateral    L-DEX MEASUREMENT EXTREMITY Upper Extremity  POSITION  Seated    DOMINANT SIDE Right     At Risk Side Right    BASELINE SCORE (UNILATERAL) 2.1    L-DEX SCORE (UNILATERAL) 2.5    VALUE CHANGE (UNILAT) 0.4          OT SESSION : Patient arrived with great improvement in active range of motion in right shoulder.  Drain was removed last week.  Shoulder range of motion within normal limits compared to prior to surgery.  Did review with patient some active assisted range of motion on the wall for shoulder flexion and abduction.  To be done daily.  10 reps.  Pain-free. Patient circumference on the right is increased compared to the left.  But patient is right-hand dominant. L-Dex score is within normal limits. Patient denies any swelling or heaviness or fullness. Patient reports increased functional use of right upper extremity. Patient is not having radiation or chemo.    PATIENT EDUCATION:  Education details: Lymphedema risk reduction and post op shoulder/posture HEP Person educated: Patient Education method: Explanation, Demonstration, Handout Education comprehension: Patient verbalized understanding and returned demonstration    ASSESSMENT:  CLINICAL IMPRESSION: Patient presented  at OT follow-up postop right mastectomy by Dr. Marinda.  On 03/23/2024.  Patient last drain was removed last week.  Patient present with great improvement in active range of motion right shoulder doing home exercises.  Active range of motion within functional limits compared to prior to surgery.  Did review with patient to continue some overhead shoulder flexion and abduction active assisted range of motion on the wall to do daily pain-free.  Patient to continue with compression per surgeon instructions-patient has a follow-up with surgeon middle of next month.  Patient circumference of right upper extremity increase compared to the left.  The patient is right-hand dominant patient L-Dex score was seen within normal limits.  Patient can follow-up with Therisa the breast navigator to repeat the L-Dex score  in 3 months.  Patient is follow-up with surgeon next month.  If she needs follow-up with OT prior to that can call and schedule.  Patient in agreement. She will benefit from a post op OT reassessment as needed and from L-Dex screens every 3 months for 2 years to detect subclinical lymphedema.  Pt will benefit from skilled therapeutic intervention to improve on the following deficits: Decreased knowledge of precautions and lymphedema education, impaired UE functional use, pain, decreased ROM, postural dysfunction.   OT treatment/interventions: ADL/self-care home management, pt/family education, therapeutic exercise,manual therapy  REHAB POTENTIAL: Good  CLINICAL DECISION MAKING: Stable/uncomplicated  EVALUATION COMPLEXITY: Low   GOALS: Goals reviewed with patient? YES  LONG TERM GOALS: (STG=LTG)    Name Target Date Goal status  1 Pt will be able to verbalize understanding of pertinent lymphedema risk reduction practices relevant to her dx specifically related to skin care.  Baseline:  No knowledge 12 wks initial  2 Pt will be able to return demo and/or verbalize understanding of the post op HEP related to regaining shoulder ROM. Baseline:  No knowledge 12 wks  progressing       4 Pt will demo she has regained full shoulder ROM and function post operatively compared to baselines.  Baseline: See objective measurements taken today. 12 wks Initial    PLAN:  OT FREQUENCY/DURATION: EVAL and 6 follow up appointment dep on progress.   PLAN FOR NEXT SESSION: will reassess 3-4 weeks post op to determine needs.   Patient will follow up at outpatient cancer rehab 3-4  weeks following surgery.  If the patient requires occupational therapy at that time, a specific plan will be dictated and sent to the referring physician for approval. T Occupational Therapy Information for After Breast Cancer Surgery/Treatment:  Lymphedema is a swelling condition that you may be at risk for in your arm if you  have lymph nodes removed from the armpit area.  After a sentinel node biopsy, the risk is approximately 5-9% and is higher after an axillary node dissection.  There is treatment available for this condition and it is not life-threatening.  Contact your physician or occupational therapist with concerns. You may begin the 4 shoulder/posture exercises (see additional sheet) when permitted by your physician (typically a week after surgery).  If you have drains, you may need to wait until those are removed before beginning range of motion exercises.  A general recommendation is to not lift your arms above shoulder height until drains are removed.  These exercises should be done to your tolerance and gently.  This is not a no pain/no gain type of recovery so listen to your body and stretch into the range of motion that you can tolerate, stopping if you have pain.  If you are having immediate reconstruction, ask your plastic surgeon about doing exercises as he or she may want you to wait. .  While undergoing any medical procedure or treatment, try to avoid blood pressure being taken or needle sticks from occurring on the arm on the side of cancer.   This recommendation begins after surgery and continues for the rest of your life.  This may help reduce your risk of getting lymphedema (swelling in your arm). An excellent resource for those seeking information on lymphedema is the National Lymphedema Network's web site. It can be accessed at www.lymphnet.org If you notice swelling in your hand, arm or breast at any time following surgery (even if it is many years from now), please contact your doctor or occupational therapist to discuss this.  Lymphedema can be treated at any time but it is easier for you if it is treated early on.  If you feel like your shoulder motion is not returning to normal in a reasonable amount of time, please contact your surgeon or occupational therapist.  [facility] [phone]. [address] [place], [place] [address]      Ancel Peters, OTR/L,CLT 04/29/2024, 4:38 PM

## 2024-05-01 ENCOUNTER — Telehealth: Payer: Self-pay

## 2024-05-01 NOTE — Telephone Encounter (Signed)
 Informed patient's PCP that patient left voicemail stating BP has been running low last reading 98/59. She is concerned if she should stop hydrochlorothiazide 25 mg. Rep stated she will reach out to patient today for scheduling. Patient aware and appreciates call

## 2024-05-21 ENCOUNTER — Encounter: Payer: Self-pay | Admitting: General Surgery

## 2024-05-21 ENCOUNTER — Ambulatory Visit (INDEPENDENT_AMBULATORY_CARE_PROVIDER_SITE_OTHER): Admitting: General Surgery

## 2024-05-21 VITALS — BP 120/72 | HR 51 | Ht 63.5 in | Wt 183.0 lb

## 2024-05-21 DIAGNOSIS — C50911 Malignant neoplasm of unspecified site of right female breast: Secondary | ICD-10-CM

## 2024-05-21 DIAGNOSIS — Z08 Encounter for follow-up examination after completed treatment for malignant neoplasm: Secondary | ICD-10-CM

## 2024-05-21 NOTE — Patient Instructions (Addendum)
 Follow up here in 8 months with an office visit and mammogram of the left breast. We will send you a letter about these appointments.   Total Mastectomy, Care After The following information offers guidance on how to care for yourself after your procedure. Your health care provider may also give you more specific instructions. If you have problems or questions, contact your health care provider. What can I expect after the procedure? After the procedure, it is common to have: Pain, soreness, or tenderness. Numbness. Swelling. Neuropathic (nerve) pain in the chest wall, armpit, or arm. Stiffness in the arm or shoulder. Feelings of stress, sadness, or depression. If the lymph nodes under your arm were removed, you may have arm swelling, weakness, or numbness on the same side of your body as your surgery. Follow these instructions at home: Incision care  Follow instructions from your health care provider about how to take care of your incision. Make sure you: Wash your hands with soap and water for at least 20 seconds before you change your bandage (dressing). If soap and water are not available, use hand sanitizer. Change your dressing as told by your health care provider. Leave stitches (sutures), skin glue, or adhesive strips in place. These skin closures may need to stay in place for 2 weeks or longer. If adhesive strip edges start to loosen and curl up, you may trim the loose edges. Do not remove adhesive strips completely unless your health care provider tells you to do that. Check your incision area every day for signs of infection. Check for: Redness, swelling, or more pain. Fluid or blood. Warmth. Pus or a bad smell. If you were sent home with a surgical drain in place, follow instructions from your health care provider. This may include emptying the drain and keeping track of the amount of drainage. Bathing Do not take baths, swim, or use a hot tub until your health care provider  approves. Ask your health care provider if you may take showers. You may only be allowed to take sponge baths. Activity  Return to your normal activities as told by your health care provider. Ask your health care provider what activities are safe for you. It may take several weeks to return to full activity. Avoid activities that take a lot of effort. Rest as told by your health care provider. Ask friends and family to help with chores, including child care, meal preparation, laundry, and shopping. Be careful to avoid any activities that could cause an injury to your arm on the side of your surgery. Do not lift anything that is heavier than 10 lb (4.5 kg), or the limit that you are told, until your health care provider says that it is safe. Avoid lifting with the arm on the side of your surgery. Do not carry heavy objects on your shoulder. After your drain is removed, do exercises to prevent stiffness and swelling in your arm. Talk with your health care provider about which exercises are safe for you. General instructions Take over-the-counter and prescription medicines only as told by your health care provider. You may eat what you usually do. Keep your arm raised (elevated) above the level of your heart when you are sitting or lying down. Do not wear tight jewelry on your arm, wrist, or fingers on the side of your surgery. You may be given a tight sleeve (compression bandage) to wear over your arm on the side of your surgery. Wear this sleeve as told by your health  care provider. Ask your health care provider when you can start wearing a bra or using a breast prosthesis. Before you are involved in certain procedures, such as giving blood or having your blood pressure checked, tell all of your health care providers if lymph nodes under your arm were removed. This is important information. Follow-up Keep all follow-up visits. This is important. Get checked for extra fluid around your lymph  nodes (lymphedema) as often as told by your health care provider. Medicines Take over-the-counter and prescription medicines as told by your health care provider. Take your antibiotic medicine as told by your health care provider. Do not stop taking the antibiotic even if you start to feel better. Ask your health care provider if the medicine prescribed to you: Requires you to avoid driving or using machinery. Can cause constipation. You may need to take these actions to prevent or treat constipation: Drink enough fluid to keep your urine pale yellow. Take over-the-counter or prescription medicines. Eat foods that are high in fiber, such as beans, whole grains, and fresh fruits and vegetables. Limit foods that are high in fat and processed sugars, such as fried or sweet foods. Lifestyle Do not use any products that contain nicotine or tobacco before the procedure. These products include cigarettes, chewing tobacco, and vaping devices, such as e-cigarettes. These can delay incision healing. If you need help quitting, ask your health care provider. Contact a health care provider if: You have chills or a fever. Your pain medicine is not working. Your arm swelling, weakness, or numbness has not improved after a few weeks. You have new swelling in your breast area or arm. You have redness, swelling, or more pain in your incision area. You have fluid or blood coming from your incision. Your incision feels warm to the touch. You have pus or a bad smell coming from your incision. Get help right away if: You have very bad pain in your breast area or arm. You have a sudden onset of chest pain. You have a fast heart rate. You have difficulty breathing. These symptoms may be an emergency. Get help right away. Call 911. Do not wait to see if the symptoms will go away. Do not drive yourself to the hospital. Summary Follow instructions from your health care provider about how to take care of your  incision. Check your incision area every day for signs of infection. Ask your health care provider what activities are safe for you. Keep all follow-up visits. This is important. Contact a health care provider if you have new swelling in your breast area or arm. This information is not intended to replace advice given to you by your health care provider. Make sure you discuss any questions you have with your health care provider. Document Revised: 07/23/2021 Document Reviewed: 07/23/2021 Elsevier Patient Education  2024 [employer].

## 2024-05-21 NOTE — Progress Notes (Signed)
 The patient returns status post right mastectomy with sentinel lymph node biopsy.  She reports doing well.  She is back to driving and has been working with therapy and it appears from their note that her range of motion is back to baseline.  She denies any drainage from her incisions.  On exam there is some fullness at the bottom flap but otherwise the incision is healing well without any erythema or drainage.  We will plan to see her after her next mammogram is complete.

## 2024-07-12 ENCOUNTER — Other Ambulatory Visit: Payer: Self-pay | Admitting: Oncology

## 2024-07-16 ENCOUNTER — Encounter: Payer: Self-pay | Admitting: Oncology

## 2024-07-16 ENCOUNTER — Inpatient Hospital Stay: Admitting: *Deleted

## 2024-07-16 ENCOUNTER — Inpatient Hospital Stay: Attending: Oncology | Admitting: Oncology

## 2024-07-16 VITALS — BP 138/76 | HR 58 | Temp 98.1°F | Resp 16 | Wt 182.0 lb

## 2024-07-16 DIAGNOSIS — Z170 Estrogen receptor positive status [ER+]: Secondary | ICD-10-CM | POA: Insufficient documentation

## 2024-07-16 DIAGNOSIS — Z79810 Long term (current) use of selective estrogen receptor modulators (SERMs): Secondary | ICD-10-CM | POA: Diagnosis not present

## 2024-07-16 DIAGNOSIS — C50911 Malignant neoplasm of unspecified site of right female breast: Secondary | ICD-10-CM | POA: Insufficient documentation

## 2024-07-16 DIAGNOSIS — M810 Age-related osteoporosis without current pathological fracture: Secondary | ICD-10-CM | POA: Diagnosis not present

## 2024-07-16 DIAGNOSIS — Z1732 Human epidermal growth factor receptor 2 negative status: Secondary | ICD-10-CM | POA: Diagnosis not present

## 2024-07-16 DIAGNOSIS — Z87891 Personal history of nicotine dependence: Secondary | ICD-10-CM | POA: Diagnosis not present

## 2024-07-16 DIAGNOSIS — Z1721 Progesterone receptor positive status: Secondary | ICD-10-CM | POA: Diagnosis not present

## 2024-07-16 DIAGNOSIS — Z803 Family history of malignant neoplasm of breast: Secondary | ICD-10-CM | POA: Diagnosis not present

## 2024-07-16 DIAGNOSIS — Z9189 Other specified personal risk factors, not elsewhere classified: Secondary | ICD-10-CM

## 2024-07-16 NOTE — Progress Notes (Signed)
 SUBJECTIVE: Pt returns for her 3 month L-Dex screen.    PAIN:  Are you having pain? No   SOZO SCREENING: Patient was assessed today using the SOZO machine to determine the lymphedema index score. This was compared to her baseline score. It was determined that she is within the recommended range when compared to her baseline and no further action is needed at this time. She will continue SOZO screenings. These are done every 3 months for 2 years post operatively followed by every 6 months for 2 years, and then annually.     L-DEX FLOWSHEETS                L-DEX LYMPHEDEMA SCREENING    Measurement Type Unilateral     L-DEX MEASUREMENT EXTREMITY Upper Extremity     POSITION  Standing     DOMINANT SIDE Right     At Risk Side Right    BASELINE SCORE (UNILATERAL) 1.2    L-DEX SCORE (UNILATERAL) -3.9    VALUE CHANGE (UNILAT) -5.1

## 2024-07-16 NOTE — Progress Notes (Signed)
  [facility]  [phone] [phone]  ID: Angela Greer OB: Sep 11, 1951  MR#: [id number]  RDW#:[id number]  Patient Care Team: Odell Chard, Edra GRADE, MD as PCP - General (Family Medicine) Jacobo Evalene PARAS, MD as Consulting Physician (Oncology)  CHIEF COMPLAINT: Pathologic stage Ia ER/PR positive, HER2 negative invasive carcinoma of the right breast.  INTERVAL HISTORY: Patient returns to clinic today for routine 23-month evaluation and assess her toleration of tamoxifen .  She currently feels well and is asymptomatic.  She is tolerating treatment without significant side effects.  She has no neurologic complaints.  She denies any recent fevers or illnesses.  She has a good appetite and denies weight loss.  She has no chest pain, shortness of breath, cough, or hemoptysis.  She denies any nausea, vomiting, constipation, or diarrhea.  She has no urinary complaints.  Patient offers no specific complaints today.  REVIEW OF SYSTEMS:   Review of Systems  Constitutional: Negative.  Negative for fever and malaise/fatigue.  Respiratory: Negative.  Negative for cough, hemoptysis and shortness of breath.   Cardiovascular: Negative.  Negative for chest pain and leg swelling.  Gastrointestinal: Negative.  Negative for abdominal pain.  Genitourinary: Negative.   Musculoskeletal: Negative.  Negative for back pain.  Skin: Negative.  Negative for rash.  Neurological: Negative.  Negative for dizziness, focal weakness and headaches.  Psychiatric/Behavioral: Negative.  The patient is not nervous/anxious.     As per HPI. Otherwise, a complete review of systems is negative.  PAST MEDICAL HISTORY: Past Medical History:  Diagnosis Date   Acid reflux    Avascular necrosis of femoral head (HCC) 05/17/2007   Diverticulitis    Dupuytren's contracture of right hand 05/17/2007   Gout    History of hiatal hernia    Hyperlipidemia, mixed 05/17/2007   Hypertension     Invasive ductal carcinoma of right breast (HCC) 02/28/2024   PONV (postoperative nausea and vomiting)     PAST SURGICAL HISTORY: Past Surgical History:  Procedure Laterality Date   BREAST BIOPSY Right 02/24/2024   Stereo bx, x-clip,path pending   BREAST BIOPSY Right 02/24/2024   Stereo bx, Coil Clip, path pending   BREAST BIOPSY Right 02/24/2024   MM RT BREAST BX W LOC DEV 1ST LESION IMAGE BX SPEC STEREO GUIDE 02/24/2024 [facility]   BREAST BIOPSY Right 02/24/2024   MM RT BREAST BX W LOC DEV EA AD LESION IMG BX SPEC STEREO GUIDE 02/24/2024 [facility]   HERNIA REPAIR Right    inguinal   MASTECTOMY W/ SENTINEL NODE BIOPSY Right 03/23/2024   Procedure: MASTECTOMY WITH SENTINEL LYMPH NODE BIOPSY;  Surgeon: Marinda Jayson KIDD, MD;  Location: [facility];  Service: General;  Laterality: Right;   TOTAL HIP ARTHROPLASTY Bilateral     FAMILY HISTORY: Family History  Problem Relation Age of Onset   Breast cancer Sister        dx 43s   Breast cancer Sister        dx 37s   Breast cancer Paternal Aunt    Breast cancer Paternal Aunt    Breast cancer Paternal Grandmother    Breast cancer Cousin    Breast cancer Cousin     ADVANCED DIRECTIVES (Y/N):  N  HEALTH MAINTENANCE: Social History   Tobacco Use   Smoking status: Former    Passive exposure: Past   Smokeless tobacco: Never  Vaping Use   Vaping status: Never Used  Substance Use Topics   Alcohol  use: No   Drug use: No  Colonoscopy:  PAP:  Bone density:  Lipid panel:  Allergies  Allergen Reactions   Pravastatin     REACTION: constipation   Pravastatin Sodium     REACTION: constipation   Simvastatin     REACTION: along with other statins causes myalgias.  CPK remains normal.    Current Outpatient Medications  Medication Sig Dispense Refill   acetaminophen  (TYLENOL ) 500 MG tablet Take 1,000 mg by mouth every 6 (six) hours as needed for moderate pain (pain score 4-6) or mild pain (pain score 1-3).      alendronate  (FOSAMAX ) 70 MG tablet Take 1 tablet (70 mg total) by mouth once a week. Take with a full glass of water on an empty stomach. 4 tablet 3   allopurinol  (ZYLOPRIM ) 100 MG tablet Take 100 mg by mouth daily.     Calcium -Magnesium-Vitamin D 600-40-500 MG-MG-UNIT TB24 Take 1 tablet by mouth 2 (two) times daily.     carboxymethylcellulose 1 % ophthalmic solution Place 1 drop into both eyes 3 (three) times daily. Refresh     docusate sodium  (STOOL SOFTENER) 100 MG capsule Take 100 mg by mouth daily as needed for mild constipation or moderate constipation.     hydrochlorothiazide (HYDRODIURIL) 25 MG tablet Take 25 mg by mouth daily.     latanoprost  (XALATAN ) 0.005 % ophthalmic solution Place 1 drop into both eyes at bedtime.     losartan (COZAAR) 25 MG tablet TAKE 1 TABLET BY MOUTH EVERY DAY FOR 30 DAYS     montelukast  (SINGULAIR ) 10 MG tablet Take 10 mg by mouth daily.     Omega-3 Fatty Acids (FISH OIL) 1200 MG CAPS Take 1,200 mg by mouth 2 (two) times daily.     ondansetron  (ZOFRAN -ODT) 4 MG disintegrating tablet Take 1 tablet (4 mg total) by mouth every 8 (eight) hours as needed for nausea or vomiting. 20 tablet 0   pantoprazole  (PROTONIX ) 40 MG tablet Take 40 mg by mouth daily.     rosuvastatin  (CRESTOR ) 10 MG tablet Take 10 mg by mouth at bedtime.     tamoxifen  (NOLVADEX ) 20 MG tablet TAKE 1 TABLET BY MOUTH EVERY DAY 90 tablet 1   No current facility-administered medications for this visit.    OBJECTIVE: Vitals:   07/16/24 1042  BP: 138/76  Pulse: (!) 58  Resp: 16  Temp: 98.1 F (36.7 C)  SpO2: 97%     Body mass index is 31.73 kg/m.    ECOG FS:0 - Asymptomatic  General: Well-developed, well-nourished, no acute distress. Eyes: Pink conjunctiva, anicteric sclera. HEENT: Normocephalic, moist mucous membranes. Breast: Right mastectomy. Lungs: No audible wheezing or coughing. Heart: Regular rate and rhythm. Abdomen: Soft, nontender, no obvious distention. Musculoskeletal: No  edema, cyanosis, or clubbing. Neuro: Alert, answering all questions appropriately. Cranial nerves grossly intact. Skin: No rashes or petechiae noted. Psych: Normal affect.   LAB RESULTS:  Lab Results  Component Value Date   NA 139 03/23/2024   K 3.4 (L) 03/23/2024   CL 103 03/23/2024   CO2 27 03/16/2024   GLUCOSE 88 03/23/2024   BUN 20 03/23/2024   CREATININE 0.80 03/23/2024   CALCIUM  10.0 03/16/2024   PROT 7.3 04/01/2017   ALBUMIN 4.0 04/01/2017   AST 27 04/01/2017   ALT 31 04/01/2017   ALKPHOS 100 04/01/2017   BILITOT 0.5 04/01/2017   GFRNONAA >60 03/16/2024   GFRAA 57 (L) 04/01/2017    Lab Results  Component Value Date   WBC 6.9 03/16/2024   HGB 15.6 (H) 03/23/2024  HCT 46.0 03/23/2024   MCV 90.3 03/16/2024   PLT 191 03/16/2024     STUDIES: No results found.   ASSESSMENT: Pathologic stage Ia ER/PR positive, HER2 negative invasive carcinoma of the right breast.  PLAN:    Pathologic stage Ia ER/PR positive, HER2 negative invasive carcinoma of the right breast: Case discussed at tumor board earlier this week with downstaging of patient's malignancy after mastectomy on Mar 23, 2024.  Patient did not require Oncotype testing, chemotherapy, or adjuvant XRT.  She initiated tamoxifen  in June 2025 and recommendation is to take 5 years of treatment completing in June 2030.  No intervention is needed.  Return to clinic in 6 months for routine evaluation.  Will repeat unilateral left screening mammogram and bone mineral density prior to next clinic visit.   Genetics: Negative.   Bone health: Patient had a bone marrow density on January 28, 2024 that revealed osteoporosis with a reported T-score of -3.7.  Continue alendronate  and tamoxifen  as prescribed.  Repeat bone mineral density as above.   Patient expressed understanding and was in agreement with this plan. She also understands that She can call clinic at any time with any questions, concerns, or complaints.    Cancer  Staging  Invasive ductal carcinoma of right breast [facility]) Staging form: Breast, AJCC 8th Edition - Pathologic stage from 04/15/2024: Stage IA (pT1a, pN0, cM0, G2, ER+, PR+, HER2-) - Signed by Jacobo Evalene PARAS, MD on 04/15/2024 Stage prefix: Initial diagnosis Histologic grading system: 3 grade system   Evalene PARAS Jacobo, MD   07/16/2024 10:54 AM

## 2024-07-29 ENCOUNTER — Other Ambulatory Visit: Payer: Self-pay | Admitting: Oncology

## 2024-10-14 ENCOUNTER — Telehealth: Payer: Self-pay | Admitting: *Deleted

## 2024-10-14 NOTE — Telephone Encounter (Signed)
 needs to cancel her appt with Ana on 12/18 due to sister having surgery.  Pt would like Ana to call back to r/s when possible

## 2024-10-22 ENCOUNTER — Ambulatory Visit: Admitting: *Deleted

## 2024-10-28 ENCOUNTER — Other Ambulatory Visit: Payer: Self-pay | Admitting: Oncology

## 2024-11-11 ENCOUNTER — Other Ambulatory Visit: Payer: Self-pay | Admitting: General Surgery

## 2025-01-13 ENCOUNTER — Ambulatory Visit: Admitting: Oncology
